# Patient Record
Sex: Male | Born: 1975 | Race: Black or African American | Hispanic: No | Marital: Single | Smoking: Never smoker
Health system: Southern US, Community
[De-identification: ages and names within clinical notes are randomized; demographics above are authoritative.]

## PROBLEM LIST (undated history)

## (undated) DIAGNOSIS — D649 Anemia, unspecified: Secondary | ICD-10-CM

## (undated) DIAGNOSIS — L0231 Cutaneous abscess of buttock: Secondary | ICD-10-CM

## (undated) DIAGNOSIS — B019 Varicella without complication: Secondary | ICD-10-CM

## (undated) DIAGNOSIS — L02419 Cutaneous abscess of limb, unspecified: Secondary | ICD-10-CM

## (undated) DIAGNOSIS — Z5189 Encounter for other specified aftercare: Secondary | ICD-10-CM

## (undated) DIAGNOSIS — L732 Hidradenitis suppurativa: Secondary | ICD-10-CM

## (undated) HISTORY — DX: Varicella without complication: B01.9

## (undated) HISTORY — PX: OTHER SURGICAL HISTORY: SHX169

## (undated) HISTORY — PX: ABCESS DRAINAGE: SHX399

## (undated) HISTORY — DX: Cutaneous abscess of buttock: L02.31

## (undated) HISTORY — DX: Cutaneous abscess of limb, unspecified: L02.419

---

## 2000-12-14 ENCOUNTER — Emergency Department (HOSPITAL_COMMUNITY): Admission: EM | Admit: 2000-12-14 | Discharge: 2000-12-14 | Payer: Self-pay | Admitting: *Deleted

## 2001-01-03 ENCOUNTER — Emergency Department (HOSPITAL_COMMUNITY): Admission: EM | Admit: 2001-01-03 | Discharge: 2001-01-03 | Payer: Self-pay | Admitting: *Deleted

## 2001-03-28 ENCOUNTER — Emergency Department (HOSPITAL_COMMUNITY): Admission: EM | Admit: 2001-03-28 | Discharge: 2001-03-29 | Payer: Self-pay | Admitting: Emergency Medicine

## 2001-03-29 ENCOUNTER — Emergency Department (HOSPITAL_COMMUNITY): Admission: EM | Admit: 2001-03-29 | Discharge: 2001-03-29 | Payer: Self-pay | Admitting: Emergency Medicine

## 2001-05-21 ENCOUNTER — Inpatient Hospital Stay (HOSPITAL_COMMUNITY): Admission: EM | Admit: 2001-05-21 | Discharge: 2001-05-27 | Payer: Self-pay

## 2001-06-01 ENCOUNTER — Inpatient Hospital Stay (HOSPITAL_COMMUNITY): Admission: RE | Admit: 2001-06-01 | Discharge: 2001-06-08 | Payer: Self-pay | Admitting: Surgery

## 2001-06-09 ENCOUNTER — Encounter (HOSPITAL_COMMUNITY): Admission: RE | Admit: 2001-06-09 | Discharge: 2001-07-14 | Payer: Self-pay | Admitting: Surgery

## 2010-07-02 ENCOUNTER — Inpatient Hospital Stay (HOSPITAL_COMMUNITY)
Admission: EM | Admit: 2010-07-02 | Discharge: 2010-07-11 | DRG: 579 | Disposition: A | Discharge status: Home Health | Payer: Self-pay | Attending: Internal Medicine | Admitting: Internal Medicine

## 2010-07-02 ENCOUNTER — Emergency Department (HOSPITAL_COMMUNITY): Payer: Self-pay

## 2010-07-02 DIAGNOSIS — D500 Iron deficiency anemia secondary to blood loss (chronic): Secondary | ICD-10-CM | POA: Diagnosis present

## 2010-07-02 DIAGNOSIS — L732 Hidradenitis suppurativa: Principal | ICD-10-CM | POA: Diagnosis present

## 2010-07-02 DIAGNOSIS — E43 Unspecified severe protein-calorie malnutrition: Secondary | ICD-10-CM | POA: Diagnosis present

## 2010-07-02 DIAGNOSIS — M8668 Other chronic osteomyelitis, other site: Secondary | ICD-10-CM | POA: Diagnosis present

## 2010-07-02 LAB — DIFFERENTIAL
Basophils Absolute: 0 10*3/uL (ref 0.0–0.1)
Eosinophils Absolute: 0.5 10*3/uL (ref 0.0–0.7)
Lymphocytes Relative: 16 % (ref 12–46)
Monocytes Relative: 6 % (ref 3–12)
Neutrophils Relative %: 73 % (ref 43–77)

## 2010-07-02 LAB — CBC
Hemoglobin: 4.9 g/dL — CL (ref 13.0–17.0)
Platelets: 460 10*3/uL — ABNORMAL HIGH (ref 150–400)
RBC: 3.15 MIL/uL — ABNORMAL LOW (ref 4.22–5.81)
WBC: 9.1 10*3/uL (ref 4.0–10.5)

## 2010-07-02 LAB — BASIC METABOLIC PANEL
CO2: 24 mEq/L (ref 19–32)
Calcium: 8 mg/dL — ABNORMAL LOW (ref 8.4–10.5)
Chloride: 101 mEq/L (ref 96–112)
Creatinine, Ser: 0.69 mg/dL (ref 0.4–1.5)
GFR calc Af Amer: >60 mL/min (ref >60)
Sodium: 132 mEq/L — ABNORMAL LOW (ref 135–145)

## 2010-07-02 LAB — LACTIC ACID, PLASMA: Lactic Acid, Venous: 1.2 mmol/L (ref 0.5–2.2)

## 2010-07-02 LAB — OCCULT BLOOD, POC DEVICE: Fecal Occult Bld: POSITIVE

## 2010-07-03 DIAGNOSIS — R195 Other fecal abnormalities: Secondary | ICD-10-CM

## 2010-07-03 DIAGNOSIS — D509 Iron deficiency anemia, unspecified: Secondary | ICD-10-CM

## 2010-07-03 LAB — FERRITIN: Ferritin: 15 ng/mL — ABNORMAL LOW (ref 22–322)

## 2010-07-03 LAB — CBC
HCT: 20.3 % — ABNORMAL LOW (ref 39.0–52.0)
Hemoglobin: 5.7 g/dL — CL (ref 13.0–17.0)
MCH: 17.5 pg — ABNORMAL LOW (ref 26.0–34.0)
MCHC: 28.1 g/dL — ABNORMAL LOW (ref 30.0–36.0)
MCV: 62.5 fL — ABNORMAL LOW (ref 78.0–100.0)
RDW: 22.3 % — ABNORMAL HIGH (ref 11.5–15.5)

## 2010-07-03 LAB — HAPTOGLOBIN: Haptoglobin: 250 mg/dL — ABNORMAL HIGH (ref 16–200)

## 2010-07-03 LAB — IRON AND TIBC: Iron: <10 ug/dL — ABNORMAL LOW (ref 42–135)

## 2010-07-03 LAB — LACTATE DEHYDROGENASE: LDH: 54 U/L — ABNORMAL LOW (ref 94–250)

## 2010-07-03 LAB — COMPREHENSIVE METABOLIC PANEL
Albumin: 1.9 g/dL — ABNORMAL LOW (ref 3.5–5.2)
Alkaline Phosphatase: 112 U/L (ref 39–117)
BUN: 6 mg/dL (ref 6–23)
CO2: 25 mEq/L (ref 19–32)
Chloride: 107 mEq/L (ref 96–112)
Creatinine, Ser: 0.73 mg/dL (ref 0.4–1.5)
GFR calc non Af Amer: >60 mL/min (ref >60)
Glucose, Bld: 91 mg/dL (ref 70–99)
Potassium: 3.9 mEq/L (ref 3.5–5.1)
Total Bilirubin: 0.7 mg/dL (ref 0.3–1.2)

## 2010-07-03 LAB — TECHNOLOGIST SMEAR REVIEW

## 2010-07-03 LAB — DIRECT ANTIGLOBULIN TEST (NOT AT ARMC): DAT, IgG: NEGATIVE

## 2010-07-03 LAB — FOLATE: Folate: 8.9 ng/mL

## 2010-07-03 LAB — PROTIME-INR: INR: 1.19 (ref 0.00–1.49)

## 2010-07-03 LAB — ANTIBODY SCREEN: Antibody Screen: NEGATIVE

## 2010-07-03 LAB — HEMOGLOBIN AND HEMATOCRIT, BLOOD
HCT: 22 % — ABNORMAL LOW (ref 39.0–52.0)
Hemoglobin: 6.2 g/dL — CL (ref 13.0–17.0)

## 2010-07-03 LAB — PREPARE RBC (CROSSMATCH)

## 2010-07-03 LAB — VITAMIN B12: Vitamin B-12: 414 pg/mL (ref 211–911)

## 2010-07-03 MED ORDER — IOHEXOL 300 MG/ML  SOLN
100.0000 mL | Freq: Once | INTRAMUSCULAR | Status: AC | PRN
  Administered 2010-07-03: 100 mL via INTRAVENOUS

## 2010-07-04 ENCOUNTER — Other Ambulatory Visit: Payer: Self-pay | Admitting: Gastroenterology

## 2010-07-04 DIAGNOSIS — M869 Osteomyelitis, unspecified: Secondary | ICD-10-CM

## 2010-07-04 DIAGNOSIS — D509 Iron deficiency anemia, unspecified: Secondary | ICD-10-CM

## 2010-07-04 DIAGNOSIS — L732 Hidradenitis suppurativa: Secondary | ICD-10-CM

## 2010-07-04 LAB — CBC
HCT: 20.7 % — ABNORMAL LOW (ref 39.0–52.0)
MCH: 18.6 pg — ABNORMAL LOW (ref 26.0–34.0)
MCHC: 29 g/dL — ABNORMAL LOW (ref 30.0–36.0)
RDW: 24.1 % — ABNORMAL HIGH (ref 11.5–15.5)

## 2010-07-04 LAB — PATHOLOGIST SMEAR REVIEW

## 2010-07-04 LAB — BASIC METABOLIC PANEL
BUN: 4 mg/dL — ABNORMAL LOW (ref 6–23)
Calcium: 7.8 mg/dL — ABNORMAL LOW (ref 8.4–10.5)
Chloride: 106 mEq/L (ref 96–112)
GFR calc Af Amer: >60 mL/min (ref >60)
GFR calc non Af Amer: >60 mL/min (ref >60)
Glucose, Bld: 74 mg/dL (ref 70–99)
Potassium: 3.7 mEq/L (ref 3.5–5.1)

## 2010-07-04 LAB — HEMOGLOBIN A1C
Hgb A1c MFr Bld: 5.4 % (ref <5.7)
Mean Plasma Glucose: 108 mg/dL (ref <117)

## 2010-07-05 ENCOUNTER — Inpatient Hospital Stay (HOSPITAL_COMMUNITY): Payer: Self-pay

## 2010-07-05 DIAGNOSIS — M869 Osteomyelitis, unspecified: Secondary | ICD-10-CM

## 2010-07-05 DIAGNOSIS — L732 Hidradenitis suppurativa: Secondary | ICD-10-CM

## 2010-07-05 LAB — HEPATIC FUNCTION PANEL
ALT: 8 U/L (ref 0–53)
AST: 13 U/L (ref 0–37)
Albumin: 1.9 g/dL — ABNORMAL LOW (ref 3.5–5.2)
Total Protein: 8.1 g/dL (ref 6.0–8.3)

## 2010-07-05 LAB — CBC
Platelets: 420 10*3/uL — ABNORMAL HIGH (ref 150–400)
RBC: 3.53 MIL/uL — ABNORMAL LOW (ref 4.22–5.81)
WBC: 9.3 10*3/uL (ref 4.0–10.5)

## 2010-07-06 LAB — CULTURE, ROUTINE-ABSCESS

## 2010-07-06 LAB — CROSSMATCH
ABO/RH(D): B POS
Unit division: 0
Unit division: 0

## 2010-07-06 LAB — CBC
HCT: 23.7 % — ABNORMAL LOW (ref 39.0–52.0)
Hemoglobin: 7 g/dL — ABNORMAL LOW (ref 13.0–17.0)
MCH: 19.9 pg — ABNORMAL LOW (ref 26.0–34.0)
MCHC: 29.5 g/dL — ABNORMAL LOW (ref 30.0–36.0)

## 2010-07-07 ENCOUNTER — Inpatient Hospital Stay (HOSPITAL_COMMUNITY): Payer: Self-pay

## 2010-07-07 DIAGNOSIS — K922 Gastrointestinal hemorrhage, unspecified: Secondary | ICD-10-CM

## 2010-07-07 LAB — CBC
HCT: 25.1 % — ABNORMAL LOW (ref 39.0–52.0)
MCV: 68.4 fL — ABNORMAL LOW (ref 78.0–100.0)
RDW: 27.4 % — ABNORMAL HIGH (ref 11.5–15.5)
WBC: 9.9 10*3/uL (ref 4.0–10.5)

## 2010-07-08 ENCOUNTER — Inpatient Hospital Stay (HOSPITAL_COMMUNITY): Payer: Self-pay

## 2010-07-08 LAB — PROTEIN ELECTROPH W RFLX QUANT IMMUNOGLOBULINS
Beta 2: 13.7 % — ABNORMAL HIGH (ref 3.2–6.5)
Beta Globulin: 7.2 % (ref 4.7–7.2)
Gamma Globulin: 36.1 % — ABNORMAL HIGH (ref 11.1–18.8)
M-Spike, %: NOT DETECTED g/dL
Total Protein ELP: 8.1 g/dL (ref 6.0–8.3)

## 2010-07-08 LAB — BASIC METABOLIC PANEL
BUN: 5 mg/dL — ABNORMAL LOW (ref 6–23)
CO2: 26 mEq/L (ref 19–32)
Calcium: 8.1 mg/dL — ABNORMAL LOW (ref 8.4–10.5)
Creatinine, Ser: 0.76 mg/dL (ref 0.4–1.5)
GFR calc Af Amer: >60 mL/min (ref >60)
Glucose, Bld: 76 mg/dL (ref 70–99)

## 2010-07-08 LAB — CBC
Hemoglobin: 7.7 g/dL — ABNORMAL LOW (ref 13.0–17.0)
MCH: 20.4 pg — ABNORMAL LOW (ref 26.0–34.0)
MCHC: 29.6 g/dL — ABNORMAL LOW (ref 30.0–36.0)

## 2010-07-08 LAB — IMMUNOFIXATION ADD-ON

## 2010-07-09 ENCOUNTER — Inpatient Hospital Stay (HOSPITAL_COMMUNITY): Payer: Self-pay

## 2010-07-09 ENCOUNTER — Other Ambulatory Visit: Payer: Self-pay | Admitting: Diagnostic Radiology

## 2010-07-09 DIAGNOSIS — M869 Osteomyelitis, unspecified: Secondary | ICD-10-CM

## 2010-07-09 DIAGNOSIS — L732 Hidradenitis suppurativa: Secondary | ICD-10-CM

## 2010-07-09 LAB — CULTURE, BLOOD (ROUTINE X 2): Culture: NO GROWTH

## 2010-07-09 LAB — CBC
MCH: 20.6 pg — ABNORMAL LOW (ref 26.0–34.0)
MCV: 70.6 fL — ABNORMAL LOW (ref 78.0–100.0)
Platelets: 351 10*3/uL (ref 150–400)
RBC: 3.88 MIL/uL — ABNORMAL LOW (ref 4.22–5.81)
RDW: 29.5 % — ABNORMAL HIGH (ref 11.5–15.5)
WBC: 10.4 10*3/uL (ref 4.0–10.5)

## 2010-07-09 LAB — IGG, IGA, IGM
IgA: 1350 mg/dL — ABNORMAL HIGH (ref 68–378)
IgG (Immunoglobin G), Serum: 2960 mg/dL — ABNORMAL HIGH (ref 694–1618)

## 2010-07-09 LAB — PREALBUMIN: Prealbumin: 7.5 mg/dL — ABNORMAL LOW (ref 17.0–34.0)

## 2010-07-10 NOTE — Consult Note (Signed)
NAME:  Martin [name], Martin [name]             ACCOUNT NO.:  [id number]  MEDICAL RECORD NO.:  [id number]           PATIENT TYPE:  I  LOCATION:  3740                         FACILITY:  [facility]  PHYSICIAN:  Gabrielle Dare. Janee Morn, M.D.DATE OF BIRTH:  29-Mar-1976  DATE OF CONSULTATION:  07/03/2010 DATE OF DISCHARGE:                                CONSULTATION   CONSULTATION TIME:  10:08 a.m.  REQUESTING PHYSICIAN:  Baltazar Najjar, MD  CONSULTING SURGEON:  Gabrielle Dare. Janee Morn, MD  GASTROENTEROLOGIST CONSULTING:  Rachael Fee, MD  REASON FOR CONSULTATION:  Back and buttock abscesses.  HISTORY OF PRESENT ILLNESS:  Martin [name] is a 35 year old black male with no significant past medical history except for prior incision and drainage of a right axillary abscess 5 years ago.  He had this abscess drained at [facility].  This was completed, then after this, he said he then developed these areas all along his buttock, along the posterior right thigh, as well as up into the sacral area.  They stopped draining at one point approximately 3 years ago, but has since then chronically draining ever.  The patient has never sought treatment for these until last night when he got tired, other than presented to the emergency department.  Upon arrival to the emergency department, the patient was also found to be anemic with a hemoglobin of 4.9.  The patient denies any melena or any difficulties with his bowels.  A CT scan of the pelvis as well as a left femur was obtained which revealed a complete destruction of the coccyx possibly secondary to osteomyelitis along with other findings.  Multiple draining wound and complex abscesses, but no definite drainable abscess.  The patient has been admitted and we have been asked to evaluate the patient for these draining wound.  REVIEW OF SYSTEMS:  Please see HPI.  Otherwise, all other systems have been reviewed and are negative except for a finding of weight  loss approximately 30-40 pounds.  FAMILY HISTORY:  Essentially negative.  PAST MEDICAL HISTORY:  Prior history of right axillary abscess and 5- year history of buttock, posterior left thigh, and sacral draining wound.  PAST SURGICAL HISTORY:  I and D of right axillary abscess.  SOCIAL HISTORY:  The patient is single with no children.  He does not work.  He denies any tobacco, but does admit to some alcohol use.  The way he makes it sound is that he was a much heavier drinker when he was younger, but has since began to drink less.  He has a history of illicit drug use, but is clean.  ALLERGIES:  NKDA.  MEDICATIONS AT HOME:  None.  PHYSICAL EXAMINATION:  GENERAL:  Martin [name] is a pleasant 36 year old black male who is currently lying in bed in no acute distress. VITAL SIGNS:  Temperature 97.6 pulse 79, respirations 18, blood pressure 96/59. HEENT:  Head is normocephalic, atraumatic.  Sclerae noninjected.  Pupils are equal, round, and reactive to light.  Ears, nose without any obvious masses or lesions.  No rhinorrhea.  Mouth is pink.  Throat shows no exudate. HEART:  Regular rate and rhythm.  Normal S1 and S2.  No murmurs, gallops, or rubs are noted.  He does have palpable carotid, radial, and pedal pulses bilaterally. LUNGS:  Clear to auscultation bilaterally with no wheezes, rhonchi, or rales noted.  Respiratory effort is nonlabored. ABDOMEN:  Soft and nontender, nondistended with active bowel sounds.  No organomegaly or masses are noted. RECTAL:  Deferred at this time. MUSCULOSKELETAL:  All four extremities are essentially symmetrical with no cyanosis, clubbing, or edema. SKIN:  The patient has extensive induration of the left posterior lateral thigh into his left buttock and into the gluteal cleft up towards his sacrum.  This appears to be like a long-chain of multiple chronic draining fistulous tract.  These tracts have a milky white purulent drainage that is somewhat  malodorous.  These are somewhat tender to palpation.  Otherwise, no definite fluctuant area is indicated, large abscess or erythema are noted.  Like this under the right axilla, the patient does have a scar noted from his prior I and D. This is also draining the same type of purulent fluid to see that is coming from the lower areas. Along the posterior lateral thigh, the patient does have several areas of crater-like wound scattered down to the leg.  These are pink in nature, however, they are very dry and do not appear to be fresh wound. NEUROLOGIC:  Cranial nerves II through XII appear to be grossly intact. Deep tendon reflex exam is deferred at this time. PSYCHIATRIC:  The patient is alert and oriented x3 with an appropriate affect.  LABORATORY DATA:  White blood cell count is 9300, hemoglobin is currently 5.7 up from 4.9, hematocrit is 20.3, platelet count is 344,000, INR is 1.19.  Sodium 139, potassium 3.9, glucose 91, BUN 6, creatinine 0.73.  LFTs were all normal.  Albumin is 1.9.  Fecal occult blood positive.  HIV is nonreactive.  Anemia panel reveals less than 10 of iron indicating iron deficiency anemia.  DIAGNOSTICS:  CT scan of the pelvis revealed a band-like subcutaneous opacities posterior to the pelvis, probably representing draining complex abscess.  These band-like opacities extend along the intragluteal fold as well.  There is destruction of the coccyx probably from osteomyelitis, less likely from the mass.  The CT scan of the femur reveals skin thickening extending along the left upper thigh medially and skin thickening and surface irregularity along the left posterior medial upper thigh probably related to infection and ulceration but no drainable abscess is seen and no osteomyelitis is seen.  IMPRESSION: 1. 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  Also under the     right axilla. 2. Iron deficiency anemia, severe. 3. 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  PLAN:  At this time, I will discuss this patient with Dr. Violeta Gelinas.  This is a very complex problem that a straightforward incision and drainage will not help.  If this truly is Hidradenitis suppurativa, the patient would likely need a plastic surgeon involved for more definitive care, then they require full excision and possible skin graft and skin flaps.  Currently that we agree with the addition of vancomycin and Zosyn.  We will obtain cultures.  He may need an Infectious Disease consultation regarding the treatment for the patient's osteomyelitis, however, the patient almost has complete destruction and I am unsure if the antibiotics are going  to make a significant differences for its further progression.  We agree with a GI workup given his severe profound anemia.  They will plan to do colonoscopy plus or minus an upper endoscopy tomorrow.  Further recommendations will be made per Dr. Janee Morn after his evaluation of the patient.     Letha Cape, PA   ______________________________ Gabrielle Dare Janee Morn, M.D.    KEO/MEDQ  D:  07/03/2010  T:  07/04/2010  Job:  [id number]  cc:   Rachael Fee, MD Baltazar Najjar, MD  Electronically Signed by Barnetta Chapel PA on 07/09/2010 01:57:22 PM Electronically Signed by Violeta Gelinas M.D. on 07/10/2010 01:44:30 PM

## 2010-07-10 NOTE — Consult Note (Signed)
NAME:  ARCHER, MOIST             ACCOUNT NO.:  [id number]  MEDICAL RECORD NO.:  [id number]           PATIENT TYPE:  I  LOCATION:  3740                         FACILITY:  [facility]  PHYSICIAN:  Rose Phi. Myna Hidalgo, M.D. DATE OF BIRTH:  10-07-1975  DATE OF CONSULTATION: DATE OF DISCHARGE:                                CONSULTATION   REFERRING PHYSICIAN:  Baltazar Najjar, MD  REASON FOR CONSULTATION: 1. Severe microcytic anemia. 2. 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 3. Osteomyelitis.  HISTORY OF PRESENT ILLNESS:  Mr. Muzquiz is a really nice 35 year old African American gentleman.  He has no obvious past medical history.  He has had a history of a chronic draining of scant abscess along the left buttock and left posterior thigh.  He has had these for about 5 years. He says he has had surgery in the past for this.  He denies any obvious bleeding.  He has had melena, bright red blood per rectum.  He has no hemoptysis or hematemesis.  There has been no hematuria.  He said he has lost about 40 pounds over the past 5 years. This has been a gradual weight loss.  He has been having more in the way of pain.  He has not seen a doctor for several years.  He subsequently came to the emergency room on the 21st.  His lab work when he showed up showed a white cell count of 9.1, hemoglobin 4.9, hematocrit 18.3, platelet count is 460.  MCV is 58.  He did have blood in stool.  His anemia panel showed a ferritin level of 115.  Total iron was less than 10.  His B12 and folate were okay.  His HIV was negative.  His metabolic panel should be a creatinine of 6 and 0.73.  Calcium was 7.9 with an albumin of 1.9.  Total protein was 8.7.  Reticulocyte count was nonexistent.  His LDH was actually low at 54.  He did have a Coombs test which was negative.  Hemoglobin was 250.  He was transfused with, I think, 2 units of blood.  GI had seen him.  They will scope him for an upper and lower endoscopy  on the 23rd.  He  did have a CT of the pelvis and femur.  CT of the pelvis showed drain complex abscesses along the left posterior lateral buttock region.  These have extended along with intragluteal fold.  There is an expansion of sciatic nerve likely due to inflammation.  There is reactive lymph nodes.  There is no obvious malignancy noted.  There is destruction of the coccyx, likely secondary to osteomyelitis.  He had skin thickening along the left upper medial thigh.  No obvious osteomyelitis along the femur.  He has been seen by surgery.  There is some consideration for surgical intervention.  He has gotten IV iron with INFeD.  I think he has ordered 2000 mg total.  We  were asked to see him and try to help with any other issues with anemia.  Again, he has had some night sweats.  He says these only happens maybe one or two  days out of the week.  He has had a negative PPD in the past.  His past medical history is unremarkable aside of the abscesses.  Allergies are none.  MEDICATIONS: 1. Protonix 40 mg p.o. daily. 2. Zosyn 0.375 g IV q.8 h.3. Vancomycin 1.25 mg q.12 h. 4. Zofran as indicated.  SOCIAL HISTORY:  He does not smoke.  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  He says he is cutting back on this. There is no history of social or recreational drug use.  FAMILY HISTORY:  Unremarkable.  He says that his sister apparently is starting to have some abscesses.  REVIEW OF SYSTEMS:  As in the history present illness.  PHYSICAL EXAMINATION:  GENERAL:  This is a well-developed, well- nourished African American gentleman in no obvious distress. VITAL SIGNS:  Temperature 97.8, pulse 71, respiratory rate 16, blood pressure 97/58. HEENT:  Normocephalic, atraumatic skull.  There are no ocular or oral lesions.  He has no palpable cervical or supraclavicular lymph nodes. There is no scleral icterus.  He has no enlarged tonsils. LUNGS:  Clear  bilaterally. CARDIAC:  Regular rate and rhythm with normal S1 and S2.  There are no murmurs, rubs, or bruits. ABDOMEN:  Soft, good bowel sounds.  There is no fluid wave.  There is no palpable abdominal mass.  No palpable hepatosplenomegaly. EXTREMITIES:  No clubbing, cyanosis, or edema. NEUROLOGIC:  No obvious neurological deficits.  Blood smear needs to be reviewed.  IMPRESSION:  Mr. Melman is a 35 year old gentleman with marked microcytic anemia.  One has to suspect this is all iron deficiency.  One has to suspect that he likely has some degree of anemia of chronic disease from these multiple abscesses.  I totally agree with the endoscopy recommendation.  Again, one has to suspect whether there is some kind of blood loss.  I do not suspect any obvious hematologic issue with his marrow.  I suppose given that he is African American, there may be hemoglobinopathy which could be contributing to the low MCV.  He says there is no obvious sickle cell in the family.  However, we will go ahead and check for hemoglobinopathy.  He is not hemolyzing.  He has no retic count.  His LDH is normal. Haptoglobin is normal.  As such, there is no evidence of any hemolyticissue.  I think it is interesting as to why he has these abscesses, we will check his immunoglobulins.  It is also interesting that his protein is high and albumin is low.  He will be awfully young to have a plasma cell disorder, but yet we will check this out.  The iron by itself, should be able to get his blood count better. However, this may take several weeks before we start to see a rise in his blood count.  We will certainly follow along and try to help out in any way that we can.  Thanks so much for allowing [place] to see Mr. Belloso again.  He is a real nice guy.     Rose Phi. Myna Hidalgo, M.D.     PRE/MEDQ  D:  07/04/2010  T:  07/05/2010  Job:  [id number]  cc:   Gabrielle Dare. Janee Morn, M.D. Dr. Arthor Captain Dr.  Maralyn Sago  Electronically Signed by Arlan Organ  on 07/10/2010 07:48:45 AM

## 2010-07-10 NOTE — Procedures (Signed)
Summary: Colonoscopy  Patient: [name] Note: All result statuses are Final unless otherwise noted.  Tests: (1) Colonoscopy (COL)   COL Colonoscopy           DONE     [facility]     [address]     [place], [place]  [address]          COLONOSCOPY PROCEDURE REPORT          PATIENT:  Martin [name], Martin [name]  MR#:  [id number]     BIRTHDATE:  1975/09/02, 34 yrs. old  GENDER:  male     ENDOSCOPIST:  Rachael Fee, MD     PROCEDURE DATE:  07/04/2010     PROCEDURE:  Diagnostic Colonoscopy     ASA CLASS:  Class II     INDICATIONS:  severe IDA, fobt +     MEDICATIONS:   Fentanyl 50 mcg IV, Versed 5 mg IV, There was     residual sedation effect present from prior procedure.          DESCRIPTION OF PROCEDURE:   After the risks benefits and     alternatives of the procedure were thoroughly explained, informed     consent was obtained.  Digital rectal exam was performed and     revealed no rectal masses.   The Pentax Colonoscope [id number]     endoscope was introduced through the anus and advanced to the     cecum, which was identified by both the appendix and ileocecal     valve, without limitations.  The quality of the prep was good,     using MoviPrep.  The instrument was then slowly withdrawn as the     colon was fully examined.     <<PROCEDUREIMAGES>>     FINDINGS:  A normal appearing cecum, ileocecal valve, and     appendiceal orifice were identified. The ascending, hepatic     flexure, transverse, splenic flexure, descending, sigmoid colon,     and rectum appeared unremarkable (see image001, image002, and     image004).   Retroflexed views in the rectum revealed no     abnormalities.    The scope was then withdrawn from the patient     and the procedure completed.     COMPLICATIONS:  None          ENDOSCOPIC IMPRESSION:     1) Normal colon; no polyps or cancers          RECOMMENDATIONS:     SBFT to check for tumors/lesions in SB that could account  for     IDA, heme +.  I suspect, however, that his anemia is mainly from     severe chronic disease and perhaps poor iron intake.          ______________________________     Rachael Fee, MD          n.     eSIGNED:   Rachael Fee at 07/04/2010 01:32 PM          [place], [id number]  Note: An exclamation mark (!) indicates a result that was not dispersed into the flowsheet. Document Creation Date: 07/04/2010 2:01 PM _______________________________________________________________________  (1) Order result status: Final Collection or observation date-time: 07/04/2010 13:26 Requested date-time:  Receipt date-time:  Reported date-time:  Referring Physician:   Ordering Physician: Rob Bunting [phone]) Specimen Source:  Source: [place] [place] Order Number: [id number] Lab site:

## 2010-07-10 NOTE — Procedures (Signed)
Summary: Upper Endoscopy  Patient: Martin [name] Note: All result statuses are Final unless otherwise noted.  Tests: (1) Upper Endoscopy (EGD)   EGD Upper Endoscopy       DONE     [facility]     [address]     [place], [place]  [address]          ENDOSCOPY PROCEDURE REPORT          PATIENT:  [name], [name]  MR#:  [id number]     BIRTHDATE:  04/19/1975, 34 yrs. old  GENDER:  male     ENDOSCOPIST:  Rachael Fee, MD     PROCEDURE DATE:  07/04/2010     PROCEDURE:  EGD with biopsy, [address]     ASA CLASS:  Class II     INDICATIONS:  severe IDA, heme +     MEDICATIONS:   Fentanyl 50 mcg IV, Versed 5 mg IV     TOPICAL ANESTHETIC:  Cetacaine Spray          DESCRIPTION OF PROCEDURE:   After the risks benefits and     alternatives of the procedure were thoroughly explained, informed     consent was obtained.  The Pentax Gastroscope [id number] endoscope     was introduced through the mouth and advanced to the second     portion of the duodenum, without limitations.  The instrument was     slowly withdrawn as the mucosa was fully examined.     <<PROCEDUREIMAGES>>     The upper, middle, and distal third of the esophagus were     carefully inspected and no abnormalities were noted. The z-line     was well seen at the GEJ. The endoscope was pushed into the fundus     which was normal including a retroflexed view. The antrum,gastric     body, first and second part of the duodenum were unremarkable.     Biopsies taken from duodenum to check for Celiac Sprue (see     image001, image002, image003, image004, and image005).     Retroflexed views revealed no abnormalities.    The scope was then     withdrawn from the patient and the procedure completed.     COMPLICATIONS:  None          ENDOSCOPIC IMPRESSION:     1) Normal EGD, biopsies taken from duodenum to check for Celiac     Sprue          RECOMMENDATIONS:     Will proceed with colonoscopy now.       ______________________________     Rachael Fee, MD          n.     eSIGNED:   Rachael Fee at 07/04/2010 01:11 PM          [place], [id number]  Note: An exclamation mark (!) indicates a result that was not dispersed into the flowsheet. Document Creation Date: 07/04/2010 2:01 PM _______________________________________________________________________  (1) Order result status: Final Collection or observation date-time: 07/04/2010 13:08 Requested date-time:  Receipt date-time:  Reported date-time:  Referring Physician:   Ordering Physician: Rob Bunting [id number]) Specimen Source:  Source: Launa [employer] Order Number: [id number] Lab site:

## 2010-07-12 LAB — CULTURE, ROUTINE-ABSCESS

## 2010-07-13 NOTE — H&P (Signed)
NAME:  Martin [name], Martin [name] NO.:  [id number]  MEDICAL RECORD NO.:  [id number]           PATIENT TYPE:  E  LOCATION:  [facility]                         FACILITY:  [facility]  PHYSICIAN:  Baltazar Najjar, MD     DATE OF BIRTH:  [birth year]  DATE OF ADMISSION:  07/02/2010 DATE OF DISCHARGE:                             HISTORY & PHYSICAL   PRIMARY CARE PHYSICIAN:  Unassigned.  CODE STATUS:  The patient is a full code.  CHIEF COMPLAINT:  Multiple left buttock and left thigh abscesses.  HISTORY OF PRESENT ILLNESS:  Martin [name] is a 35 year old African American man presented to the hospital today with a chief complaint of multiple skin abscesses along his left buttock and posterior aspect of his left thigh.  As per him, he have those lesions for several years more than 5 years.  He was last seen at [facility] above 5 years ago.  However, never saw a doctor afterwards and as per his description, there was excessive drain on and off, described that the drainage as purulent drainage and since the drainage has been getting worse, the patient decided to come to the ER.  He denies any fever or chills.  Denies any nausea, vomiting or change in his bowel habits.  He stated that he lost more than 40 pounds of weight over the last several years because he was feeling down and not eating well.  In the ED, the patient was found to be profoundly anemic with a hemoglobin of 4.  His stool for occult blood was positive and we were asked to see him for admission.  PAST MEDICAL HISTORY:  Recurrent skin abscesses as above.  ALLERGIES:  No known drug allergies.  MEDICATIONS:  None.  FAMILY HISTORY:  He had a sister with similar skin lesions.  SOCIAL HISTORY:  Lives with his mother.  Denies smoking or illicit drug use.  He drinks alcohol occasionally.  REVIEW OF SYSTEMS:  As above in HPI.  CHEST:  Denies any cough or shortness of breath.  CARDIOVASCULAR:  Denies any chest pain, palpitation.   ABDOMEN:  Denies any nausea, vomiting or change in his bowel habits.  Denies any hematochezia or hemoptysis.  GU:  Denies any dysuria or urinary frequency.  CONSTITUTIONAL:  Denies any fever or chills.  PHYSICAL EXAMINATION:  VITAL SIGNS:  Blood pressure 106/70, pulse 80, temperature 98.2, O2 sat 100% on room air. GENERAL:  He is alert, not in acute distress. NECK:  Supple.  No JVD. LUNGS:  Clear to auscultation bilaterally. CARDIOVASCULAR:  S1-S2, regular rhythm and rate. ABDOMEN:  Soft, nontender. EXTREMITIES:  Showed no pedal edema. SKIN:  Showed multiple skin ulcers/abscesses extending from his left gluteal region down his left buttock and left posterior thigh.  The largest lesion is about 5 x 3 cm.  There is some positive drainage from some of those lesions noted. NEUROLOGIC:  The patient is alert and oriented x3.  No focal deficit appreciated.  LABORATORY STUDIES:  Stool for occult blood positive.  Hemoglobin 4.9, hematocrit 18.3, WBCs 9.1 and platelet count 460.  Lactic acid 1.2. Sodium 132, potassium 3.6, BUN 9,  creatinine 0.69, calcium of 8.  RADIOLOGY/IMAGING:  CT of the left femur with contrast and CT of pelvis with contrast showed band-like subcutaneous opacities posterior to the pelvis probably represents draining complex of abscesses, draining along the left posterolateral buttock region, this band-like opacity is extend along the intragluteal fold.  There is an extension of the left sciatic nerve which may be due to inflammation.  Reactive lower basic lymph nodes present.  No draining abscess identified and component of the band- like opacities may represent phlegmon.  Destruction of the coccyx probably from osteomyelitis and this likely from a mass.  There is also possibility of early Fournier gangrene.  CT of femur showed skin thickening extend along the left upper thigh medially and skin thickening and surface irregularity on the left posteromedial upper thigh  probably related to infection and ulceration.  No drainable abscess seen.  No osteomyelitis noted.  ASSESSMENT AND PLAN: 1. Multiple gluteal/buttock/left thigh abscesses. 2. 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 3. Profound anemia with positive fecal occult blood.  PLAN:  The patient will be admitted to the medical floor. We will send blood cultures x2 and start the patient on vancomycin and Zosyn. Surgical Service was already contacted.  Dr. Lindie Spruce  from General Surgery will see him in the a.m. to assess him for the need for surgical debridement. Wound care consult. For anemia, the patient is asymptomatic and it seems to be chronic anemia.  The patient is typed and crossed and he will receive 2 units of blood transfusion.  We will repeat his H and H q.8 h., transfuse as needed.  I will also consult GI for further evaluation of his anemia and further need for scoping.  We will place him on clear liquid diet and IV Protonix. I will check his HIV status. The patient is full code.          ______________________________ Baltazar Najjar, MD     SA/MEDQ  D:  07/02/2010  T:  07/02/2010  Job:  [id number]  Electronically Signed by Hannah Beat MD on 07/13/2010 08:55:54 PM

## 2010-07-14 LAB — CULTURE, ROUTINE-ABSCESS
Culture: NO GROWTH
Gram Stain: NONE SEEN

## 2010-07-21 NOTE — Discharge Summary (Signed)
NAME:  Martin [name], Martin [name] NO.:  [id number]  MEDICAL RECORD NO.:  [id number]           PATIENT TYPE:  I  LOCATION:  3038                         FACILITY:  [facility]  PHYSICIAN:  Jeoffrey Massed, MD    DATE OF BIRTH:  [birth year]  DATE OF ADMISSION:  07/02/2010 DATE OF DISCHARGE:  07/11/2010                        DISCHARGE SUMMARY - REFERRING   PRIMARY DISCHARGE DIAGNOSES: 1. Extensive hidradenitis suppurativa involving his left gluteal     region, left posterior thigh mostly and his lower back as well.     Also involving his left axillary region. 2. Microcytic iron-deficiency anemia with EGD and colonoscopy     negative. 3. Malnutrition. 4. Coccygeal osteomyelitis, likely chronic.  DISCHARGE MEDICATIONS:  Include the following, 1. Doxycycline 100 mg p.o. b.i.d. for 30 days. 2. Ensure 237 mL p.o. twice daily. 3. Ferrous sulfate 325 mg 1 tablet p.o. twice daily. 4. Multivitamins 1 tablet p.o. daily. 5. Protein supplement liquid 30 mL p.o. twice daily.  CONSULTATIONS: 1. Dr. Orvan Falconer from Infectious Disease. 2. [facility]. 3. Hematology, Dr. Arlan Organ. 4. Dr. Noelle Penner from Plastic Surgery.  BRIEF HISTORY OF PRESENT ILLNESS:  The patient is a very pleasant 35- year-old unfortunate African-American male with a history of hidradenitis suppurativa, which has been going on for years.  Came into the ED for multiple draining ulcers on his left buttock and left thigh region.  He was then admitted to the hospitalist service for further evaluation and treatment.  For further details, please see the history and physical that was dictated by Dr. Baltazar Najjar.  PROCEDURES PERFORMED: 1. The patient underwent EGD on July 04, 2010, which was essentially     normal. 2. The patient also underwent a colonoscopy on July 04, 2010, which     was essentially normal. 3. The patient underwent a CT-guided biopsy of his coccygeal bone,     which showed inflammatory  cells, predominantly neutrophils.  RADIOLOGICAL STUDIES: 1. CT of the pelvis with contrast showed band-like subcutaneous     opacities posterior to the pelvis, probably representing a draining     complex of abscesses.  These band-like opacities extend along into     the intragluteal fold.  There is expansion of the left sciatic     nerve, which may be due to inflammation.  Reactive lower pelvic     lymph nodes are present.  No definite fluid-filled nondraining     abscesses is identified, and components of band-like opacities may     represent a phlegmon.  Destruction of the coccyx, probably from     osteomyelitis and less likely from a mass. 2. CT of the upper leg with contrast showed skin thickening extends     along this left upper thigh medially and skin thickening and     surface irregularity along the left posterior medial upper thigh,     probably related to infection and ulceration. 3. Small bowel series showed normal appearing small bowel with a large     stool burden.  PERTINENT LABORATORY STUDIES: 1. Culture of the coccygeal biopsy is negative so far. 2. Blood cultures done on July 03, 2010, were negative. 3. IgA in the serum was 1350, IgG was 2960, and IgM was 153. 4. Immunofixation showed area of slightly restricted mobility in the     IgG and  kappa links.  Suggested to repeat in 6 to 8 months if     clinically indicated. 5. Serum protein electrophoresis did not detect an M-spike. 6. Culture of the abscess was done on July 03, 2010, was negative. 7. HbA1c is 5.4. 8. TSH is 2.010, LDH was 54. 9. Direct antiglobulin test was negative. 10.Fecal occult blood was positive.  BRIEF HOSPITAL COURSE: 1. Hidradenitis suppurativa.  The patient was admitted to the     hospital, given empiric vancomycin and Zosyn.  Given a CT scan     results as noted above, General Surgery was consulted and they did     recommend that there was no abscesses to drain.  Subsequently,      Plastic Surgery was consulted to evaluate for debridement and flap.     Plastic Surgery did evaluate this patient and for his sacrum,     scrotum, and perineum wound, they thought that these wounds were     too large to be reconstructed.  However, they thought thatthe axillary wounds     could be possibly excised with wide local excision and     reconstruction.  However, upon further evaluation, they did     recommend a Dermatology evaluation and subsequently that was     provided by [facility].  They thought that this patient     should be transferred to academic center like [facility].  Dr. Noelle Penner did discuss     this case with the Dermatology clinic at the [facility] and subsequently     has instructed the patient to call the provided number to make an     appointment.  The patient has called this number and has made an     appointment with Dr. Caryn Section for August 06, 2010, at 2 p.m. at [facility].  In     terms of antibiotics, the CAT scan did show that he had some     coccygeal destruction.  It was felt that before we commit him to     long-term IV antibiotics to do a biopsy to see if this inflammation     was active.  The biopsy was done.  The cultures are so far negative     and the pathology does show that he has inflammatory cell mostly     neutrophils.  Dr. Orvan Falconer did follow this patient today and he has     suggested that this patient be switched to doxycycline for 30 days     as noted above.  The patient will follow up with [facility] on August 06, 2010, for possible immunotherapy.  His PICC     line will be now discontinued.  [facility]     [facility]ound care in the interim. 2. Iron deficiency anemia.  The patient had an extensive workup     including EGD and colonoscopy, both of which were normal.  He also     had a small-bowel follow-through that did not demonstrate a mass.     The patient was seen by  Hematology and Gastroenterology.  He was     given IV iron and then eventually switched over to oral iron, which  he will continue. 3. Malnutrition.  The patient's malnutrition is probably due to his     chronic inflammatory state and poor oral intake.  He has been     provided the above-noted supplements. 4. Coccygeal destruction.  This is probably secondary to his chronic     inflammatory state, perhaps chronic     osteomyelitis.  His biopsy results are noted as above.  The patient     will be taking doxycycline 100 mg p.o. b.i.d. for 30 days.  Total time spent for coordinating discharge is 45 minutes.     Jeoffrey Massed, MD     SG/MEDQ  D:  07/11/2010  T:  07/11/2010  Job:  [id number]  cc:   Loreta Ave, MD Dr. Caryn Section at [facility]  Electronically Signed by Jeoffrey Massed  on 07/21/2010 08:12:41 PM

## 2010-07-31 NOTE — Group Therapy Note (Signed)
NAME:  Martin [name], Martin [name] NO.:  [id number]  MEDICAL RECORD NO.:  [id number]           PATIENT TYPE:  I  LOCATION:  3038                         FACILITY:  [facility]  PHYSICIAN:  Clydia Llano, MD       DATE OF BIRTH:  Nov 02, 1975                                PROGRESS NOTE   PRIMARY CARE PHYSICIAN: [facility].  REASON FOR ADMISSION: Multiple skin lesions and severe anemia.  CURRENT DIAGNOSES: 1. Hidradenitis suppurativa 2. Microcytic iron-deficiency anemia. 3. Malnutrition 4. Coccygeal osteomyelitis.  Discharge medications to be dictated at the time of actual discharge.  RADIOLOGY: 1. Chest x-ray, March 27, showed right PICC line with tip within the     cavoatrial junction. 2. Small-bowel followthrough.  Normal-appearing small bowel. 3. CT scan of pelvis showed band-like subcutaneous opacity posterior     to the pelvis, probably representing a draining complex of     abscesses.  There is expansion of the left sciatic nerve which     might be due to inflammation.  There is reactive lower pelvic lymph     nodes.  There are no definite fluid-filled nondraining abscesses     identified. 4. CT scan of the femur showed skin thickening extending along the     left upper thigh medially and skin thickening and surface     irregularity along the left posteromedial upper thigh probably     related to infection and ulceration.  There is also destruction of     the coccyx probably from osteomyelitis and is less likely from a     mass.  PROCEDURES: 1. EGD, March 23, showed normal EGD. 2. Colonoscopy showed normal colon on March 23. 3. Blood transfusion of 40 units.  BRIEF HISTORY AND EXAMINATION: Martin [name] is a 35 year old African American male with history of hidradenitis suppurativa, came into the hospital complaining about worsening of his lesions.  The patient has multiple skin lesions along his left gluteal region and the posterior upper aspect of his  left thigh.  As per him, he had this lesion for several years.  He had surgery in 2003 for it.  But recently he noticed there is more drainage and felt very weak lately.  Upon initial evaluation in the emergency department, the patient told him that he lost about 40 pounds over the past several years.  He is found to be profoundly anemic with hemoglobin of 4.9.  He has positive stool occult blood and he has very extensive lesions in the gluteal and at the right axillary regions.  BRIEF HOSPITAL STAY: 1. Hidradenitis suppurativa.  The patient admitted to the hospital,     started on IV antibiotics vancomycin and Zosyn for possible     infection.  General Surgery was consulted to evaluate the patient.     They did recommend to continue antibiotics and there were no     abscesses to drain.  General Surgery recommended to call Plastic     Surgery to evaluate for debridement/skin flaps.  I called Dr.     Noelle Penner.  Dr. Noelle Penner and his nurse Amy is going to come and evaluate  the patient in the morning.  Meanwhile continue the antibiotics. 2. Coccygeal osteomyelitis.  The patient on vancomycin and Zosyn since     admission.  The osteomyelitis diagnosis is based on the destruction     of the coccyx which might be secondary to something else, but it is     likely secondary to osteomyelitis.  I spoke with Dr. Orvan Falconer from     Infectious Diseases and he recommended biopsy to be done to rule     out other causes of destruction of the coccyx as well as to see of     the infection is active or not and hopefully if we can get     microbiologic diagnosis of the osteomyelitis.  CT scan-guided     biopsy is ordered for Interventional Radiology to check on him. 3. Profound anemia.  The patient has microcytic deficiency anemia.     The patient has positive fecal occult blood when he came in.     [facility] was consulted.  EGD and colonoscopy were done which were     normal.  Small-bowel followthrough  also were normal.  The profound     iron-deficiency anemia might be from his nutritional status plus     chronic inflammatory status from his hidradenitis suppurativa and     osteomyelitis. 4. Malnutrition.  This patient's malnutrition is probably secondary to     chronic inflammation and poor oral intake.  His albumin is 1.9 and     prealbumin is 3.9.  I will repeat these tests in the morning.  As     the patient was started on multivitamin and seen by the dietitian     in the hospital hoping it will improve his nutritional status. 5. Iron deficiency.  It is hard to say it is secondary to chronic     blood loss or poor oral intake.  The patient got intravenous iron     with a total of 2000 elemental mg of iron.  The patient also put on     iron supplements for now.     Clydia Llano, MD     ME/MEDQ  D:  07/08/2010  T:  07/08/2010  Job:  [id number]  Electronically Signed by Clydia Llano  on 07/31/2010 05:06:08 PM

## 2010-08-07 LAB — FUNGUS CULTURE W SMEAR

## 2010-12-18 ENCOUNTER — Emergency Department (HOSPITAL_COMMUNITY)
Admission: EM | Admit: 2010-12-18 | Discharge: 2010-12-18 | Disposition: A | Discharge status: Home / Self Care | Payer: Self-pay | Attending: Emergency Medicine | Admitting: Emergency Medicine

## 2010-12-18 DIAGNOSIS — L299 Pruritus, unspecified: Secondary | ICD-10-CM | POA: Insufficient documentation

## 2010-12-18 DIAGNOSIS — H9209 Otalgia, unspecified ear: Secondary | ICD-10-CM | POA: Insufficient documentation

## 2010-12-18 DIAGNOSIS — R21 Rash and other nonspecific skin eruption: Secondary | ICD-10-CM | POA: Insufficient documentation

## 2010-12-18 DIAGNOSIS — R609 Edema, unspecified: Secondary | ICD-10-CM | POA: Insufficient documentation

## 2010-12-18 DIAGNOSIS — H7290 Unspecified perforation of tympanic membrane, unspecified ear: Secondary | ICD-10-CM | POA: Insufficient documentation

## 2010-12-18 DIAGNOSIS — L02419 Cutaneous abscess of limb, unspecified: Secondary | ICD-10-CM | POA: Insufficient documentation

## 2010-12-18 DIAGNOSIS — H6690 Otitis media, unspecified, unspecified ear: Secondary | ICD-10-CM | POA: Insufficient documentation

## 2012-05-30 IMAGING — CT CT PELVIS W/ CM
2 of 7 series · 15 of 46 positions shown, 17 images · IV contrast (APPLIED)
Comparison: None.
COMPARISON: None.

CT THE PELVIS WITH CONTRAST; CT OF THE LEFT FEMUR WITH CONTRAST
CLINICAL DATA: Infection. Boil in the left posterior buttock
region extending down the femur.

CT PELVIS WITH CONTRAST
TECHNIQUE: Multidetector CT imaging of the pelvis was performed
using the standard protocol following the bolus administration of
intravenous contrast.
Contrast:  100 ml Smnipaque-5RR
TECHNIQUE: Multidetector CT imaging of the left upper leg was
performed according to the standard protocol following intravenous
contrast administration. Multiplanar CT image reconstructions were
also generated.
Contrast: 100 ml Smnipaque-5RR

[Series 6: pelvis 2.0 b31f · axial · 0.40mm/px · z∈[+545,+1084]mm · 12 of 589 slices shown, 14 images]
[im 25/589  soft-tissue]
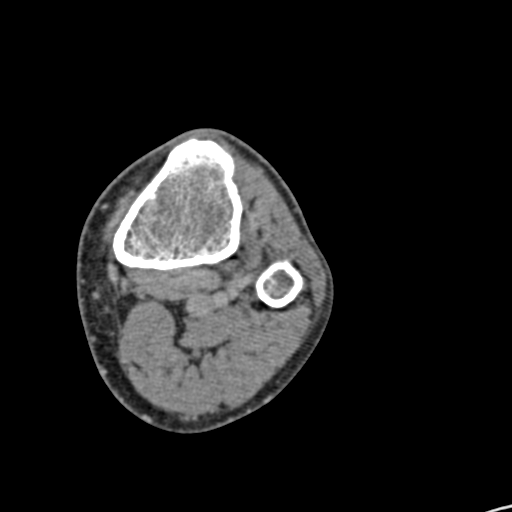
[im 25/589  bone]
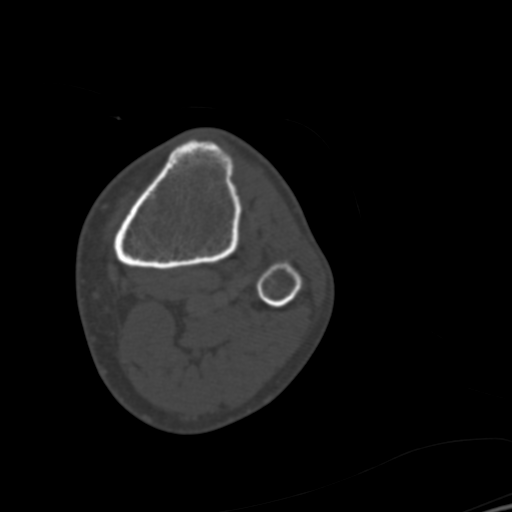
[im 74/589  soft-tissue]
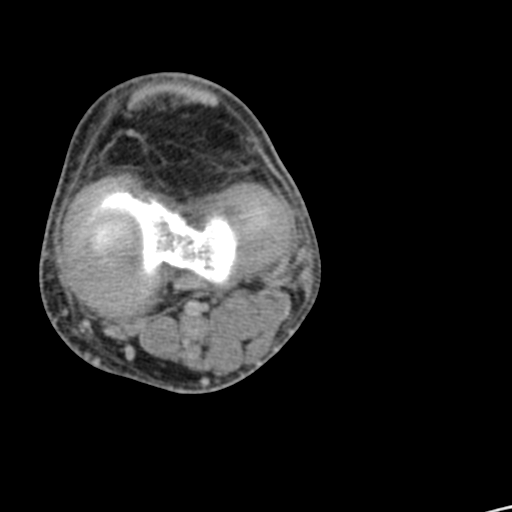
[im 123/589  soft-tissue]
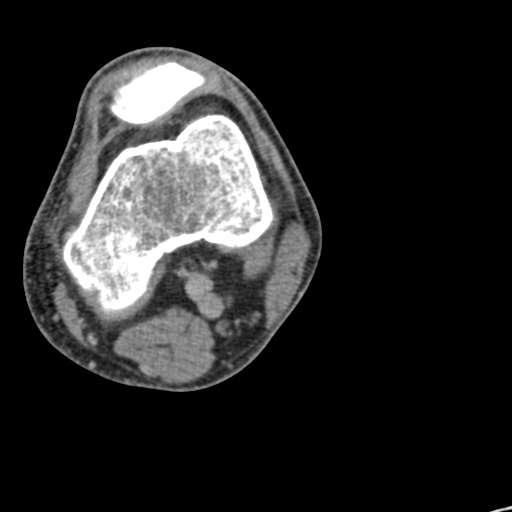
[im 172/589  soft-tissue]
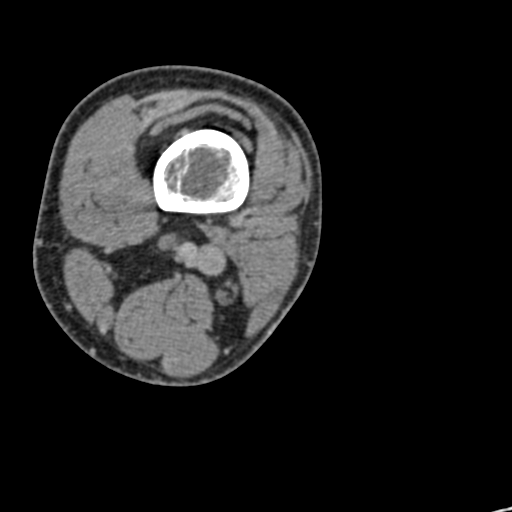
[im 221/589  soft-tissue]
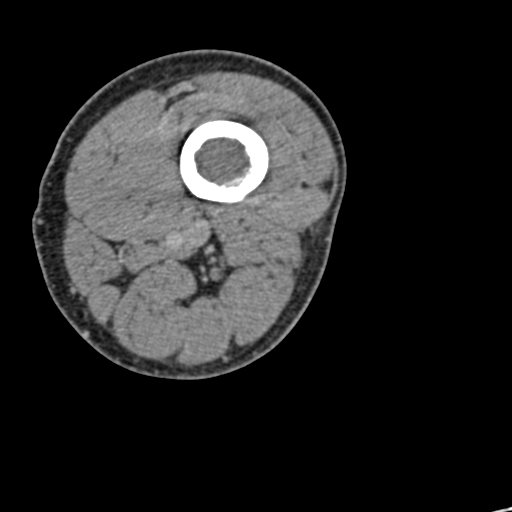
[im 270/589  soft-tissue]
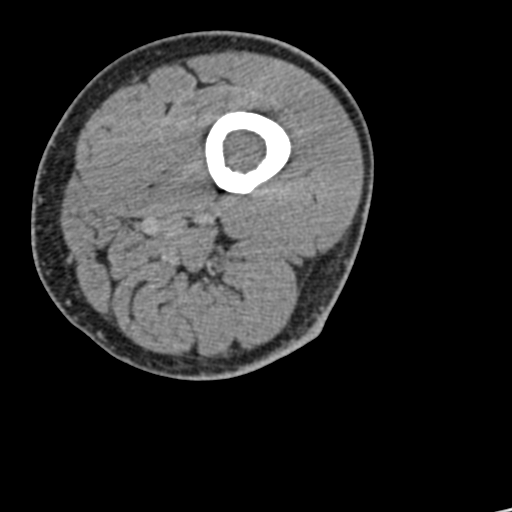
[im 319/589  soft-tissue]
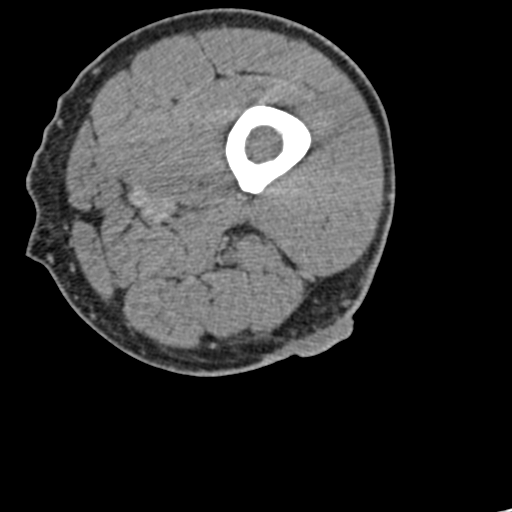
[im 368/589  soft-tissue]
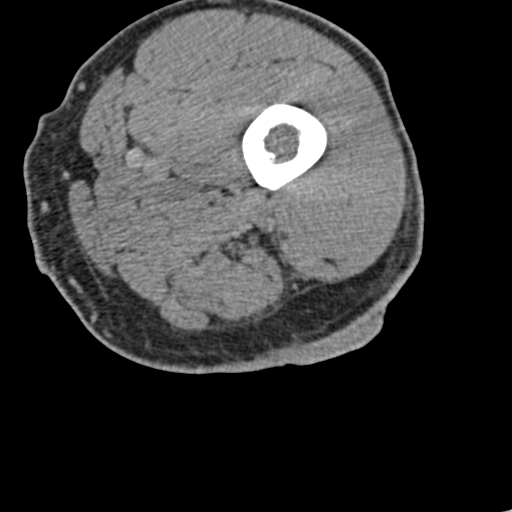
[im 417/589  soft-tissue]
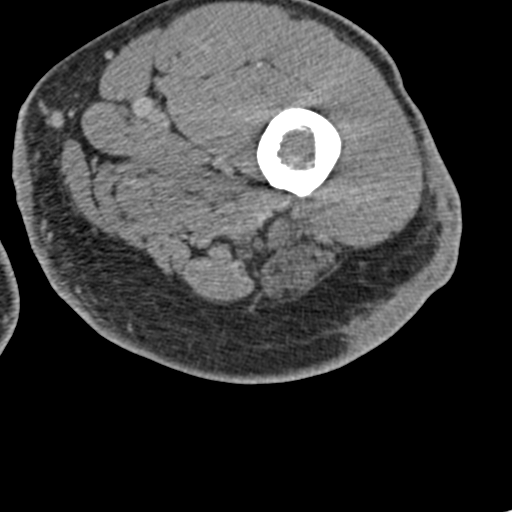
[im 417/589  bone]
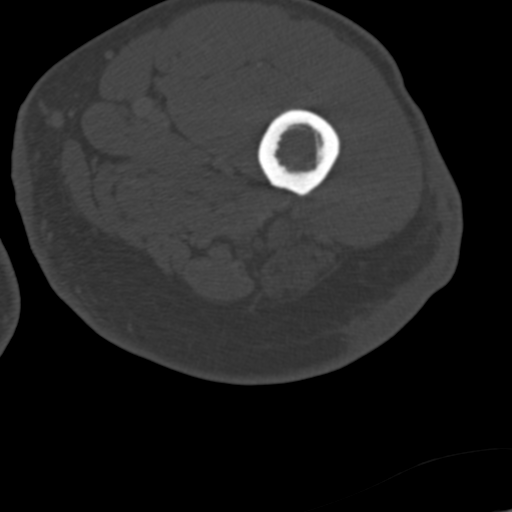
[im 466/589  soft-tissue]
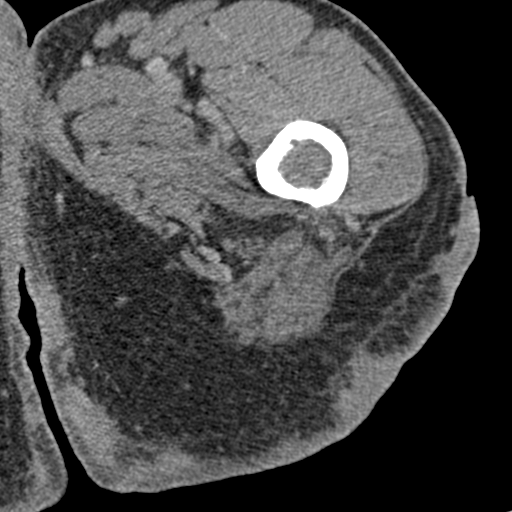
[im 515/589  soft-tissue]
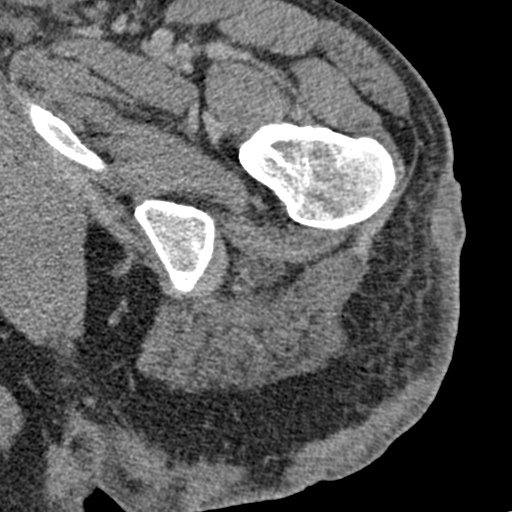
[im 564/589  soft-tissue]
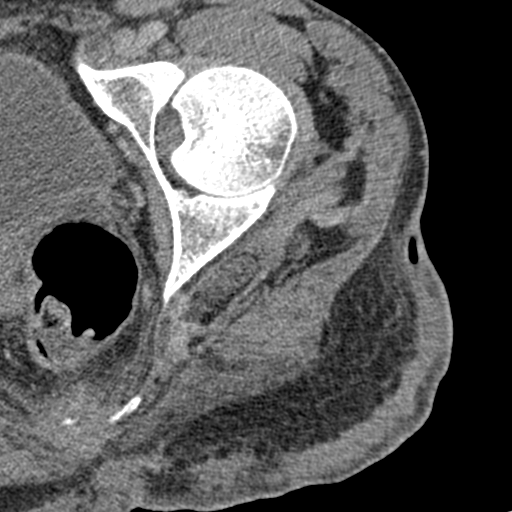

[Series 602: <mpr range> · coronal · 0.76mm/px · 3 of 107 slices shown]
[im 27/107  soft-tissue]
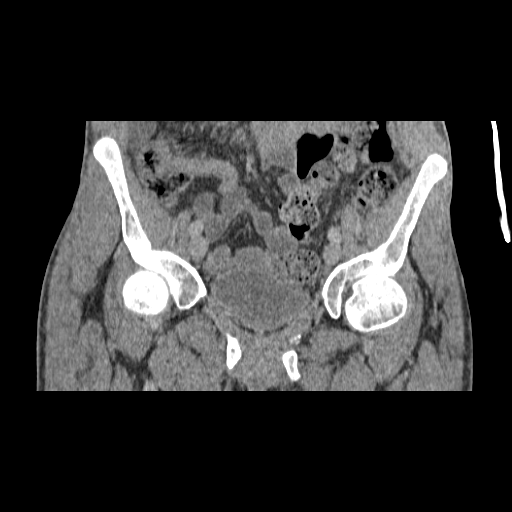
[im 54/107  soft-tissue]
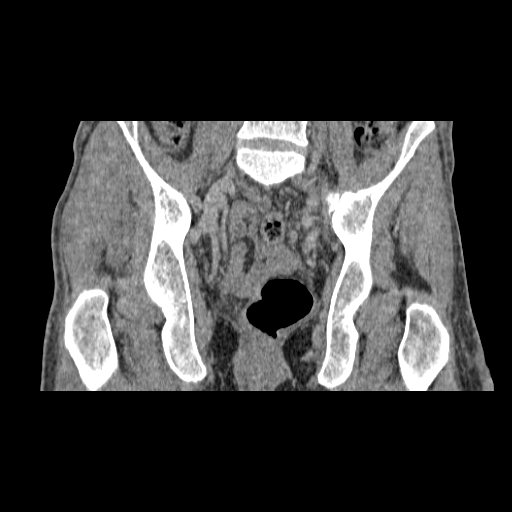
[im 80/107  soft-tissue]
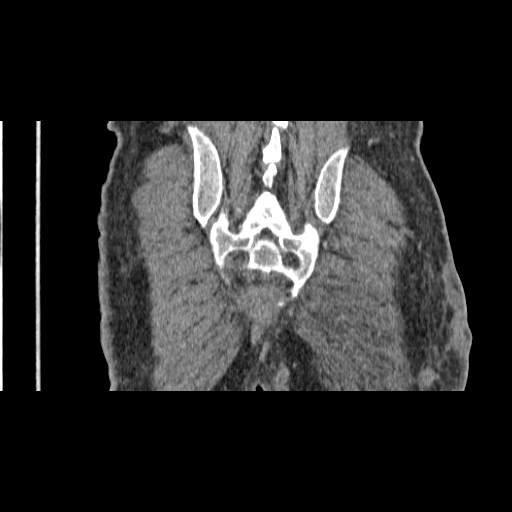

[15 of 46 positions shown; findings below may reference images not displayed]

FINDINGS: A long abnormal band of density in the subcutaneous
tissues posterior to the bony pelvis suggest draining abscess
collection, potentially with some ulceration in the left lateral
buttock region.  There is some relative atrophy of the lower
portion of the left gluteus maximus muscle, and the piriformis
muscles appear symmetric.  However, there is some abnormal
expansion of the left sciatic nerve, possibly from inflammation.

There is destruction or absence of the coccyx, probably from
infection but conceivably from a mass.  In the expected vicinity of
the coccyx there is only a soft tissue density, without overt
pelvic ascites.  No perirectal abscess is identified.  Small
inguinal nodes are probably reactive.

A left common iliac node measures 1.3 cm in short axis dimension.

The obturator internus muscles appear normal and symmetric.

No hip effusion is identified.  There is some expansion of the
subcutaneous tissues on the left side, as well as extension of the
band like subcutaneous opacities along the intra gluteal fold.
IMPRESSION: 1.  Band-like subcutaneous opacities posterior to the pelvis,
probably representing a draining complex of abscesses (draining
along the left posterolateral buttock region).  These band-like
opacities extend along the intra gluteal fold.
2.  There is expansion of the left sciatic nerve which may be due
to inflammation.
3.  Reactive lower pelvic lymph nodes are present.
4.  No definite fluid-filled non draining abscess is identified,
and components of the band-like opacities may represent phlegmon.
5.  Destruction of the coccyx, probably from osteomyelitis, and
less likely from a mass.
6.  Although aggressive therapy is likely warranted in any case, if
the patient has diabetes then very close observation to exclude the
possibility of Minxia Yadee gangrene, which can be fulminant,
would be recommended.

----

CT OF THE LEFT UPPER LEG WITH CONTRAST
FINDINGS: Abnormal skin thickening is present medially along the
left upper thigh and also laterally and posteriorly along the left
upper thigh, possibly with some skin ulceration along the left
posterolateral thigh.

No muscular involvement is identified

The distal superficial femoral node has a short axis diameter of 8
mm, and is likely reactive.

No knee effusion identified.  No compelling findings of femoral
osteomyelitis.
IMPRESSION: 1.  Skin thickening extends along the left upper thigh medially and
skin thickening and surface irregularity along the left
posteromedial upper thigh, probably related to infection and
ulceration.  No drainable abscess is seen.  No osteomyelitis noted.

## 2012-06-13 ENCOUNTER — Emergency Department (INDEPENDENT_AMBULATORY_CARE_PROVIDER_SITE_OTHER)
Admission: EM | Admit: 2012-06-13 | Discharge: 2012-06-13 | Disposition: A | Discharge status: Home / Self Care | Payer: Self-pay | Source: Home / Self Care | Attending: Family Medicine | Admitting: Family Medicine

## 2012-06-13 ENCOUNTER — Encounter (HOSPITAL_COMMUNITY): Payer: Self-pay

## 2012-06-13 DIAGNOSIS — H7291 Unspecified perforation of tympanic membrane, right ear: Secondary | ICD-10-CM

## 2012-06-13 DIAGNOSIS — H7290 Unspecified perforation of tympanic membrane, unspecified ear: Secondary | ICD-10-CM

## 2012-06-13 DIAGNOSIS — L309 Dermatitis, unspecified: Secondary | ICD-10-CM

## 2012-06-13 DIAGNOSIS — H6690 Otitis media, unspecified, unspecified ear: Secondary | ICD-10-CM

## 2012-06-13 DIAGNOSIS — L259 Unspecified contact dermatitis, unspecified cause: Secondary | ICD-10-CM

## 2012-06-13 DIAGNOSIS — H6691 Otitis media, unspecified, right ear: Secondary | ICD-10-CM

## 2012-06-13 HISTORY — DX: Hidradenitis suppurativa: L73.2

## 2012-06-13 MED ORDER — AMOXICILLIN-POT CLAVULANATE 875-125 MG PO TABS: 1.0000 | ORAL_TABLET | Freq: Two times a day (BID) | ORAL | Status: DC

## 2012-06-13 MED ORDER — NEOMYCIN-POLYMYXIN-HC 3.5-10000-1 OT SUSP: 4.0000 [drp] | Freq: Three times a day (TID) | OTIC | Status: DC

## 2012-06-13 NOTE — ED Provider Notes (Signed)
History     CSN: [id number]  Arrival date & time 06/13/12  1449   First MD Initiated Contact with Patient 06/13/12 1528      Chief Complaint  Patient presents with  . Rash    (Consider location/radiation/quality/duration/timing/severity/associated sxs/prior treatment) Patient is a 37 y.o. male presenting with rash and ear drainage. The history is provided by the patient.  Rash Location:  Leg Leg rash location:  R lower leg and L lower leg Quality: itchiness and scaling   Severity:  Mild Progression:  Unchanged Chronicity:  Chronic Relieved by:  Moisturizers Ear Drainage This is a chronic problem. The current episode started more than 1 week ago (over 1 month). The problem has not changed since onset.   Past Medical History  Diagnosis Date  . Hydradenitis     History reviewed. No pertinent past surgical history.  History reviewed. No pertinent family history.  History  Substance Use Topics  . Smoking status: Never Smoker   . Smokeless tobacco: Not on file  . Alcohol Use: Yes      Review of Systems  Constitutional: Negative.   HENT: Positive for ear discharge. Negative for ear pain.   Skin: Positive for rash.    Allergies  Review of patient's allergies indicates no known allergies.  Home Medications   Current Outpatient Rx  Name  Route  Sig  Dispense  Refill  . amoxicillin-clavulanate (AUGMENTIN) 875-125 MG per tablet   Oral   Take 1 tablet by mouth 2 (two) times daily.   20 tablet   0   . doxycycline (VIBRAMYCIN) 100 MG capsule   Oral   Take 100 mg by mouth 2 (two) times daily.         Marland Kitchen neomycin-polymyxin-hydrocortisone (CORTISPORIN) 3.5-10000-1 otic suspension   Right Ear   Place 4 drops into the right ear 3 (three) times daily.   10 mL   1     BP 103/63  Pulse 79  Temp(Src) 98.4 F (36.9 C) (Oral)  SpO2 100%  Physical Exam  Nursing note and vitals reviewed. Constitutional: He appears well-developed and well-nourished.  HENT:    Head: Normocephalic and atraumatic.  Right Ear: There is drainage and swelling. No tenderness. Tympanic membrane is perforated. Tympanic membrane is not bulging.  Left Ear: External ear normal.  Mouth/Throat: Oropharynx is clear and moist.  Skin: Skin is warm and dry. Rash noted.       ED Course  Procedures (including critical care time)  Labs Reviewed - No data to display No results found.   1. Perforated tympanic membrane, right   2. Otitis media of right ear   3. Chronic eczema       MDM          Linna Hoff, MD 06/13/12 [phone]

## 2012-06-13 NOTE — ED Notes (Signed)
C/o rash on legs and ear infection

## 2012-08-24 ENCOUNTER — Encounter (HOSPITAL_COMMUNITY): Payer: Self-pay | Admitting: *Deleted

## 2012-08-24 ENCOUNTER — Emergency Department (HOSPITAL_COMMUNITY)
Admission: EM | Admit: 2012-08-24 | Discharge: 2012-08-24 | Disposition: A | Discharge status: Home / Self Care | Payer: Self-pay | Source: Home / Self Care | Attending: Family Medicine | Admitting: Family Medicine

## 2012-08-24 DIAGNOSIS — H60399 Other infective otitis externa, unspecified ear: Secondary | ICD-10-CM

## 2012-08-24 DIAGNOSIS — H6691 Otitis media, unspecified, right ear: Secondary | ICD-10-CM

## 2012-08-24 DIAGNOSIS — H6690 Otitis media, unspecified, unspecified ear: Secondary | ICD-10-CM

## 2012-08-24 MED ORDER — NEOMYCIN-POLYMYXIN-HC 3.5-10000-1 OT SUSP: 4.0000 [drp] | Freq: Three times a day (TID) | OTIC | Status: DC

## 2012-08-24 MED ORDER — FLUCONAZOLE 150 MG PO TABS: ORAL_TABLET | ORAL | Status: DC

## 2012-08-24 MED ORDER — CIPROFLOXACIN HCL 500 MG PO TABS: 500.0000 mg | ORAL_TABLET | Freq: Two times a day (BID) | ORAL | Status: DC

## 2012-08-24 NOTE — ED Notes (Signed)
C/o R ear pain onset in March and took antibiotics and ear drops but it did not go away.  Did not have insurance or the money for another visit.  R ear started draining onset 2 weeks ago.  States its not really painful just irritating.  Slight decrease in his hearing.

## 2012-08-24 NOTE — ED Provider Notes (Signed)
History     CSN: [id number]  Arrival date & time 08/24/12  1610   First MD Initiated Contact with Patient 08/24/12 1703      Chief Complaint  Patient presents with  . Otalgia    (Consider location/radiation/quality/duration/timing/severity/associated sxs/prior treatment) HPI Comments: 37 year old male with history of being treated for otitis media with TM perforation with Augmentin and Cortisporin almost a month ago. Comes complaining of right ear drainage. Denies pain. Patient states that his symptoms improved but never completely resolved despite finishing his oral antibiotic and otic drops. He started to percent with abundant right ear drainage again 2 weeks ago. Denies fever or chills. No sore throat. No nasal congestion. No headaches. No neck pain. Denies ear pain. Patient does not have insurance.   Past Medical History  Diagnosis Date  . Hydradenitis     History reviewed. No pertinent past surgical history.  History reviewed. No pertinent family history.  History  Substance Use Topics  . Smoking status: Never Smoker   . Smokeless tobacco: Not on file  . Alcohol Use: 4.8 oz/week    4 Glasses of wine, 4 Shots of liquor per week      Review of Systems  Constitutional: Negative for fever and chills.  HENT: Positive for ear discharge. Negative for hearing loss, congestion, rhinorrhea, neck pain and tinnitus.   Respiratory: Negative for cough.   Gastrointestinal: Negative for nausea and vomiting.  Neurological: Negative for dizziness and headaches.  All other systems reviewed and are negative.    Allergies  Review of patient's allergies indicates no known allergies.  Home Medications   Current Outpatient Rx  Name  Route  Sig  Dispense  Refill  . ciprofloxacin (CIPRO) 500 MG tablet   Oral   Take 1 tablet (500 mg total) by mouth 2 (two) times daily.   14 tablet   0   . fluconazole (DIFLUCAN) 150 MG tablet      1 tab po q72 h x3   3 tablet   0   .  neomycin-polymyxin-hydrocortisone (CORTISPORIN) 3.5-10000-1 otic suspension   Right Ear   Place 4 drops into the right ear 3 (three) times daily.   10 mL   0     BP 98/55  Pulse 89  Temp(Src) 98.9 F (37.2 C) (Oral)  Resp 16  SpO2 100%  Physical Exam  Nursing note and vitals reviewed. Constitutional: He is oriented to person, place, and time. He appears well-developed and well-nourished. No distress.  HENT:  Head: Normocephalic and atraumatic.  Mouth/Throat: No oropharyngeal exudate.  Op clear and moist. Nose normal. Right  ear canal with erythema, swelling and liquid yellow exudate with foul smell. There is a small perforation of the TM. TM does no appear red or swollen. No tenderness over mastoid process. Left ear with wax plug but was able to visualize TM appears normal.   Eyes: Conjunctivae and EOM are normal. Pupils are equal, round, and reactive to light. Right eye exhibits no discharge. Left eye exhibits no discharge.  Neck: Neck supple.  Cardiovascular: Normal heart sounds.   Pulmonary/Chest: Breath sounds normal.  Lymphadenopathy:    He has no cervical adenopathy.  Neurological: He is alert and oriented to person, place, and time.  Skin: No rash noted. He is not diaphoretic.    ED Course  Procedures (including critical care time)  Labs Reviewed  CULTURE, ROUTINE-ABSCESS   No results found.   1. Otitis externa of right ear  MDM  Right otitis media with TM perforation. Status post Augmentin and Cortisporin. External ear canal exudate  sample was sent for culture. Concerned for a pseudomonal infection. Prescribed Cipro, Diflucan and Cortisporin.   ENT referral.   supportive care and red flags that should prompt his return medical attention discussed with patient and provided in writing.         Sharin Grave, MD 08/26/12 [phone]

## 2012-08-26 LAB — EAR CULTURE

## 2012-08-28 NOTE — ED Notes (Signed)
Ear culture R ear:  Abundant Diptheroids (Corynebacterium species).  No sensitivity. Pt. treated with Cipro and Cortisporin Otic suspension.  Message sent to Dr. Tressia Danas. Vassie Moselle 08/28/2012

## 2012-08-29 ENCOUNTER — Telehealth (HOSPITAL_COMMUNITY): Payer: Self-pay | Admitting: *Deleted

## 2012-08-29 NOTE — ED Notes (Signed)
I called pt.  Pt. verified x 2 and given results.  I asked how he is doing.  He said he has no pain and the drainage decreased from all day to once a day, just a small amount.  He said he has a few pills left.  I told him the doctor said if it was not better to give him a Rx. Erythromycin.  I told him it was expensive without insurance.  He said his grandmother would get it for him. I told him to call me back if the drainage is not gone when he finishes the antibiotic.  He said he uses [facility] on [address]. Vassie Moselle 08/29/2012

## 2012-08-31 MED ORDER — ERYTHROMYCIN BASE 500 MG PO TABS: 500.0000 mg | ORAL_TABLET | Freq: Three times a day (TID) | ORAL | Status: DC

## 2012-09-01 ENCOUNTER — Telehealth (HOSPITAL_COMMUNITY): Payer: Self-pay | Admitting: *Deleted

## 2012-09-01 NOTE — ED Notes (Signed)
Pt. called back and said he has finished his Cipro.  He said he still has drainage from his ear, but just a small amount now. It was dark green but now it is light green.  I told him that Dr. Tressia Danas discussed it with me yesterday and decided to e-prescribe him Erythromycin to the [facility] at [facility] and [address]. 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  If the Erythromycin does not stop the drainage, he will have to f/u with the ENT.  Pt. voiced understanding. Martin [name] 09/01/2012

## 2012-11-13 ENCOUNTER — Emergency Department (HOSPITAL_COMMUNITY)
Admission: EM | Admit: 2012-11-13 | Discharge: 2012-11-13 | Disposition: A | Discharge status: Home / Self Care | Payer: Self-pay | Attending: Emergency Medicine | Admitting: Emergency Medicine

## 2012-11-13 ENCOUNTER — Encounter (HOSPITAL_COMMUNITY): Payer: Self-pay | Admitting: Emergency Medicine

## 2012-11-13 DIAGNOSIS — R599 Enlarged lymph nodes, unspecified: Secondary | ICD-10-CM | POA: Insufficient documentation

## 2012-11-13 DIAGNOSIS — H9120 Sudden idiopathic hearing loss, unspecified ear: Secondary | ICD-10-CM | POA: Insufficient documentation

## 2012-11-13 DIAGNOSIS — H66019 Acute suppurative otitis media with spontaneous rupture of ear drum, unspecified ear: Secondary | ICD-10-CM | POA: Insufficient documentation

## 2012-11-13 DIAGNOSIS — J029 Acute pharyngitis, unspecified: Secondary | ICD-10-CM | POA: Insufficient documentation

## 2012-11-13 DIAGNOSIS — J3489 Other specified disorders of nose and nasal sinuses: Secondary | ICD-10-CM | POA: Insufficient documentation

## 2012-11-13 MED ORDER — AMOXICILLIN 500 MG PO CAPS: 500.0000 mg | ORAL_CAPSULE | Freq: Three times a day (TID) | ORAL | Status: DC

## 2012-11-13 NOTE — ED Provider Notes (Signed)
Medical screening examination/treatment/procedure(s) were performed by non-physician practitioner and as supervising physician I was immediately available for consultation/collaboration.   Sagrario Lineberry, MD 11/13/12 1457 

## 2012-11-13 NOTE — ED Notes (Signed)
Pt reports one week hx of ear pain

## 2012-11-13 NOTE — ED Provider Notes (Signed)
CSN: [id number]     Arrival date & time 11/13/12  1231 History     First MD Initiated Contact with Patient 11/13/12 1241     Chief Complaint  Patient presents with  . Otalgia    1 week hx of ear pain   (Consider location/radiation/quality/duration/timing/severity/associated sxs/prior Treatment) HPI  Martin [name] is a 37 y.o. male complaining of painless (noted this contradicts triage note) drainage from right ear starting 2 days ago. Patient endorses rhinorrhea, decreased hearing acuity, otalgia that resolved before the drainage started. Patient denies fever, cough, shortness of breath, pharyngitis, tinnitus.   Past Medical History  Diagnosis Date  . Hydradenitis    History reviewed. No pertinent past surgical history. History reviewed. No pertinent family history. History  Substance Use Topics  . Smoking status: Never Smoker   . Smokeless tobacco: Not on file  . Alcohol Use: 4.8 oz/week    4 Glasses of wine, 4 Shots of liquor per week    Review of Systems 10 systems reviewed and found to be negative, except as noted in the HPI   Allergies  Review of patient's allergies indicates no known allergies.  Home Medications   Current Outpatient Rx  Name  Route  Sig  Dispense  Refill  . ciprofloxacin (CIPRO) 500 MG tablet   Oral   Take 1 tablet (500 mg total) by mouth 2 (two) times daily.   14 tablet   0   . erythromycin (E-MYCIN) 500 MG tablet   Oral   Take 1 tablet (500 mg total) by mouth 3 (three) times daily.   21 tablet   0   . neomycin-polymyxin-hydrocortisone (CORTISPORIN) 3.5-10000-1 otic suspension   Right Ear   Place 4 drops into the right ear 3 (three) times daily.   10 mL   0    BP 105/55  Pulse 98  Temp(Src) 98.5 F (36.9 C) (Oral)  SpO2 100% Physical Exam  Nursing note and vitals reviewed. Constitutional: He is oriented to person, place, and time. He appears well-developed and well-nourished. No distress.  HENT:  Head: Normocephalic.   Mouth/Throat: Oropharynx is clear and moist. No oropharyngeal exudate.  Purulent drainage in right outer ear canal, tympanic membrane is not visualized.  Left tympanic membrane shows mild bulging and erythema with blunted light reflex.  Posterior pharynx is mildly injected, there is no tonsillar hypertrophy or exudate.  Eyes: Conjunctivae and EOM are normal. Pupils are equal, round, and reactive to light.  Neck: Normal range of motion.  Shotty anterior cervical lymphadenopathy, nontender.  Cardiovascular: Normal rate and regular rhythm.   Pulmonary/Chest: Effort normal and breath sounds normal. No stridor. No respiratory distress. He has no wheezes. He has no rales. He exhibits no tenderness.  Abdominal: Soft. There is no tenderness.  Musculoskeletal: Normal range of motion.  Neurological: He is alert and oriented to person, place, and time.  Psychiatric: He has a normal mood and affect.    ED Course   Procedures (including critical care time)  Labs Reviewed - No data to display No results found. 1. Otitis media with spontaneous rupture of eardrum, right     MDM   Filed Vitals:   11/13/12 1253  BP: 105/55  Pulse: 98  Temp: 98.5 F (36.9 C)  TempSrc: Oral  SpO2: 100%     Martin [name] is a 37 y.o. male with otitis media and tympanic membrane rupture on the right. Patient does have a history of frequent ear infections. I advised him  to try to keep water out of the ear is much as possible. Advised him also to follow with ENT and audiology.   Pt is hemodynamically stable, appropriate for, and amenable to discharge at this time. Pt verbalized understanding and agrees with care plan. All questions answered. Outpatient follow-up and specific return precautions discussed.    Discharge Medication List as of 11/13/2012  1:30 PM    START taking these medications   Details  amoxicillin (AMOXIL) 500 MG capsule Take 1 capsule (500 mg total) by mouth 3 (three) times daily.,  Starting 11/13/2012, Until Discontinued, Print        Note: Portions of this report may have been transcribed using voice recognition software. Every effort was made to ensure accuracy; however, inadvertent computerized transcription errors may be present    Wynetta Emery, PA-C 11/13/12 1455

## 2013-10-12 ENCOUNTER — Encounter (HOSPITAL_COMMUNITY): Payer: Self-pay | Admitting: Emergency Medicine

## 2013-10-12 ENCOUNTER — Emergency Department (INDEPENDENT_AMBULATORY_CARE_PROVIDER_SITE_OTHER)
Admission: EM | Admit: 2013-10-12 | Discharge: 2013-10-12 | Disposition: A | Discharge status: Home / Self Care | Payer: Self-pay | Source: Home / Self Care

## 2013-10-12 DIAGNOSIS — L080 Pyoderma: Secondary | ICD-10-CM

## 2013-10-12 DIAGNOSIS — Z872 Personal history of diseases of the skin and subcutaneous tissue: Secondary | ICD-10-CM

## 2013-10-12 MED ORDER — CLINDAMYCIN HCL 300 MG PO CAPS: 300.0000 mg | ORAL_CAPSULE | Freq: Three times a day (TID) | ORAL | Status: DC

## 2013-10-12 NOTE — ED Notes (Addendum)
Pt refused dressing. Pt states that he wants the wound to air out. [facility]P

## 2013-10-12 NOTE — ED Notes (Signed)
Abscess on buttocks, history of the same.  Reports abscess getting worse.

## 2013-10-12 NOTE — ED Notes (Signed)
Refused dressing, Martin Rasmussendavid mabe, np aware

## 2013-10-12 NOTE — ED Provider Notes (Signed)
CSN: [id number]     Arrival date & time 10/12/13  1210 History   First MD Initiated Contact with Patient 10/12/13 1258     Chief Complaint  Patient presents with  . Abscess   (Consider location/radiation/quality/duration/timing/severity/associated sxs/prior Treatment) HPI Comments: 38 year old male with a long history of multiple abscesses to the buttocks and left posterior thigh. He said several I&D's leaving scars covering both buttocks as well as the left posterior thigh. He is complaining of small areas of purulent drainage to include the gluteal cleft and 2-3 areas of the lower left buttock and posterior thigh.   Past Medical History  Diagnosis Date  . Hydradenitis    History reviewed. No pertinent past surgical history. No family history on file. History  Substance Use Topics  . Smoking status: Never Smoker   . Smokeless tobacco: Not on file  . Alcohol Use: 4.8 oz/week    4 Glasses of wine, 4 Shots of liquor per week    Review of Systems  Constitutional: Negative.   Skin: Positive for wound.    Allergies  Review of patient's allergies indicates no known allergies.  Home Medications   Prior to Admission medications   Medication Sig Start Date End Date Taking? Authorizing Provider  clindamycin (CLEOCIN) 300 MG capsule Take 1 capsule (300 mg total) by mouth 3 (three) times daily. 10/12/13   Hayden Rasmussenavid Jeyli Zwicker, NP   BP 112/61  Pulse 85  Temp(Src) 98.4 F (36.9 C) (Oral)  Resp 18  SpO2 100% Physical Exam  Nursing note and vitals reviewed. Constitutional: He is oriented to person, place, and time. He appears well-developed and well-nourished. No distress.  Neck: Normal range of motion.  Pulmonary/Chest: Effort normal. No respiratory distress.  Neurological: He is oriented to person, place, and time.  Skin: Skin is warm and dry.  Iliana her percent of the surface area of the left buttock and posterior left thigh is scarred with areas of purulence as described above. There is  currently purulence draining from the gluteal cleft. No abscess can be palpated or visualized. No erythema, lymphangitis or other signs of cellulitis or abscess formation.  Psychiatric: He has a normal mood and affect.    ED Course  Procedures (including critical care time) Labs Review Labs Reviewed  WOUND CULTURE    Imaging Review No results found.   MDM   1. Pyoderma (skin infection)   2. History of abscess of skin and subcutaneous tissue     No signs of abscess formation Wound cult obtained Clindamycin 300 bid Needs PCP     Hayden Rasmussenavid Lorell Thibodaux, NP 10/12/13 1335

## 2013-10-13 NOTE — ED Provider Notes (Signed)
Medical screening examination/treatment/procedure(s) were performed by a resident physician or non-physician practitioner and as the supervising physician I was immediately available for consultation/collaboration.  Evan Corey, MD    Evan S Corey, MD 10/13/13 1715 

## 2013-10-14 LAB — WOUND CULTURE

## 2013-10-16 NOTE — ED Notes (Signed)
Wound culture: Few Strep G.  Pt. adequately treated with Cleocin. Vassie MoselleYork, Sadako Cegielski M 10/16/2013

## 2013-10-25 ENCOUNTER — Ambulatory Visit (INDEPENDENT_AMBULATORY_CARE_PROVIDER_SITE_OTHER): Payer: Self-pay | Admitting: Physician Assistant

## 2013-10-25 ENCOUNTER — Encounter: Payer: Self-pay | Admitting: Physician Assistant

## 2013-10-25 VITALS — BP 96/54 | HR 90 | Temp 98.4°F | Resp 16 | Ht 71.25 in | Wt 186.8 lb

## 2013-10-25 DIAGNOSIS — L732 Hidradenitis suppurativa: Secondary | ICD-10-CM

## 2013-10-25 MED ORDER — HYDROCODONE-ACETAMINOPHEN 10-325 MG PO TABS: 1.0000 | ORAL_TABLET | Freq: Three times a day (TID) | ORAL | Status: DC | PRN

## 2013-10-25 MED ORDER — DOXYCYCLINE HYCLATE 100 MG PO TABS
100.0000 mg | ORAL_TABLET | Freq: Two times a day (BID) | ORAL | Status: DC
Start: 2013-10-25 — End: 2013-10-26

## 2013-10-25 NOTE — Progress Notes (Signed)
Pre visit review using our clinic review tool, if applicable. No additional management support is needed unless otherwise documented below in the visit note/SLS  

## 2013-10-25 NOTE — Progress Notes (Signed)
Patient presents to clinic today to establish care.  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  Patient states he was recently in the ER for acute abscess and drainage.  Was given prescription for Bactrim that endorses taking with resolution of drainage.  Is looking for ongoing treatment for his condition.  Has previously been seen at [facility] but has not followed up in a couple of years.  Patient denies fever, chills or malaise.  Denies current area of concern.  Endorses some mild, chronic serous drainage from healing wound openings.  Endorses family history of hidradenitis suppurativa.   Health Maintenance: Dental -- overdue Vision -- overdue Immunizations -- Unsure of last tetanus; Over 10 years.  Colonoscopy -- 2013; no abnormalities.  Past Medical History  Diagnosis Date  . Hydradenitis   . Abscess of buttock   . Chicken pox   . Abscess of thigh s/p I&D in past 10/26/2013    Past Surgical History  Procedure Laterality Date  . Unremarkable.    . Abcess drainage      No current outpatient prescriptions on file prior to visit.   No current facility-administered medications on file prior to visit.    No Known Allergies  Family History  Problem Relation Age of Onset  . Healthy Mother     Living  . Diabetes Maternal Grandmother   . Hypertension Maternal Grandfather   . Emphysema Maternal Uncle     x2-Deceased  . Other Sister     Hydradenitis    History   Social History  . Marital Status: Single    Spouse Name: N/A    Number of Children: N/A  . Years of Education: N/A   Occupational History  . Not on file.   Social History Main Topics  . Smoking status: Never Smoker   . Smokeless tobacco: Never Used  . Alcohol Use: 4.8 oz/week    4 Glasses of wine, 4 Shots of liquor per week  . Drug Use: No  . Sexual Activity: No     Comment: sexually active with men previously   Other  Topics Concern  . Not on file   Social History Narrative  . No narrative on file   Review of Systems  Constitutional: Negative for fever, chills and malaise/fatigue.  HENT: Negative for ear discharge, ear pain, hearing loss and tinnitus.   Eyes: Negative for blurred vision and double vision.  Respiratory: Negative for cough and shortness of breath.   Cardiovascular: Negative for chest pain.  Gastrointestinal: Negative.   Genitourinary: Negative.   Neurological: Negative for dizziness, loss of consciousness and headaches.  Psychiatric/Behavioral: Negative for depression, suicidal ideas, hallucinations and substance abuse. The patient is not nervous/anxious and does not have insomnia.     BP 96/54  Pulse 90  Temp(Src) 98.4 F (36.9 C) (Oral)  Resp 16  Ht 5' 11.25" (1.81 m)  Wt 186 lb 12 oz (84.709 kg)  BMI 25.86 kg/m2  SpO2 98%  Physical Exam  Vitals reviewed. Constitutional: He is oriented to person, place, and time and well-developed, well-nourished, and in no distress.  HENT:  Head: Normocephalic and atraumatic.  Right Ear: External ear normal.  Left Ear: External ear normal.  Nose: Nose normal.  Mouth/Throat: Oropharynx is clear and moist. No oropharyngeal exudate.  TM within normal limits bilaterally.  Neck: Neck supple. No thyromegaly present.  Cardiovascular: Normal rate, regular rhythm, normal heart sounds and intact distal  pulses.   Pulmonary/Chest: Effort normal and breath sounds normal. No respiratory distress. He has no wheezes. He has no rales. He exhibits no tenderness.  Lymphadenopathy:    He has no cervical adenopathy.  Neurological: He is alert and oriented to person, place, and time.  Skin:     Psychiatric: Affect normal.    Recent Results (from the past 2160 hour(s))  WOUND CULTURE     Status: None   Collection Time    10/12/13  1:26 PM      Result Value Ref Range   Specimen Description WOUND BUTTOCKS     Special Requests GLUTEAL CLEFT      Gram Stain       Value: MODERATE WBC PRESENT,BOTH PMN AND MONONUCLEAR     NO SQUAMOUS EPITHELIAL CELLS SEEN     RARE GRAM POSITIVE COCCI     IN PAIRS     [facility]   Culture       Value: FEW STREPTOCOCCUS GROUP G     Performed at [facility]   Report Status 10/14/2013 FINAL      Assessment/Plan: Hidradenitis suppurativa of trunk, genetalia, thighs - EXTENSIVE Extensive disease.  Likely with very significant tracking of sinuses.  Rx Doxycycline.  Urgent referral placed to General surgery for evaluation.

## 2013-10-25 NOTE — Patient Instructions (Signed)
Please take medications as directed.   Keep area clean and dry.  You will be contacted by Surgery for evaluation.  I have made this an urgent referral so they should work you in within the next week.  If you do not hear from them within 24 hours, please give me a call.  If you develop any fever, chills or worsening pain, please return to the office.

## 2013-10-26 ENCOUNTER — Encounter (INDEPENDENT_AMBULATORY_CARE_PROVIDER_SITE_OTHER): Payer: Self-pay | Admitting: Surgery

## 2013-10-26 ENCOUNTER — Ambulatory Visit (INDEPENDENT_AMBULATORY_CARE_PROVIDER_SITE_OTHER): Payer: Self-pay | Admitting: Surgery

## 2013-10-26 VITALS — BP 122/68 | HR 78 | Temp 97.5°F | Ht 72.0 in | Wt 189.0 lb

## 2013-10-26 DIAGNOSIS — L732 Hidradenitis suppurativa: Secondary | ICD-10-CM

## 2013-10-26 DIAGNOSIS — L02419 Cutaneous abscess of limb, unspecified: Secondary | ICD-10-CM

## 2013-10-26 HISTORY — DX: Cutaneous abscess of limb, unspecified: L02.419

## 2013-10-26 MED ORDER — DOXYCYCLINE HYCLATE 100 MG PO TABS: 100.0000 mg | ORAL_TABLET | Freq: Two times a day (BID) | ORAL | Status: AC

## 2013-10-26 NOTE — Patient Instructions (Signed)
Please consider the recommendations that we have given you today:  [facility] with [facility] reconstructive surgery & dermatology for medical, antibiotic, surgical options to help get this severe disease under better control.  Please call our office at [phone] if you have further questions / concerns.    Hidradenitis Suppurativa, Sweat Gland Abscess Hidradenitis suppurativa is a long lasting (chronic), uncommon disease of the sweat glands. With this, boil-like lumps and scarring develop in the groin, some times under the arms (axillae), and under the breasts. It may also uncommonly occur behind the ears, in the crease of the buttocks, and around the genitals.  CAUSES  The cause is from a blocking of the sweat glands. They then become infected. It may cause drainage and odor. It is not contagious. So it cannot be given to someone else. It most often shows up in puberty (about 1910 to 36 years of age). But it may happen much later. It is similar to acne which is a disease of the sweat glands. This condition is slightly more common in African-Americans and women. SYMPTOMS   Hidradenitis usually starts as one or more red, tender, swellings in the groin or under the arms (axilla).  Over a period of hours to days the lesions get larger. They often open to the skin surface, draining clear to yellow-colored fluid.  The infected area heals with scarring. DIAGNOSIS  Your caregiver makes this diagnosis by looking at you. Sometimes cultures (growing germs on plates in the lab) may be taken. This is to see what germ (bacterium) is causing the infection.  TREATMENT   Topical germ killing medicine applied to the skin (antibiotics) are the treatment of choice. Antibiotics taken by mouth (systemic) are sometimes needed when the condition is getting worse or is severe.  Avoid tight-fitting clothing which traps moisture in.  Dirt does not cause hidradenitis and it is  not caused by poor hygiene.  Involved areas should be cleaned daily using an antibacterial soap. Some patients find that the liquid form of Lever 2000, applied to the involved areas as a lotion after bathing, can help reduce the odor related to this condition.  Sometimes surgery is needed to drain infected areas or remove scarred tissue. Removal of large amounts of tissue is used only in severe cases.  Birth control pills may be helpful.  Oral retinoids (vitamin A derivatives) for 6 to 12 months which are effective for acne may also help this condition.  Weight loss will improve but not cure hidradenitis. It is made worse by being overweight. But the condition is not caused by being overweight.  This condition is more common in people who have had acne.  It may become worse under stress. There is no medical cure for hidradenitis. It can be controlled, but not cured. The condition usually continues for years with periods of getting worse and getting better (remission). Document Released: 11/12/2003 Document Revised: 06/22/2011 Document Reviewed: 11/28/2007 [facility] Patient Information 2015 [employer] [place]. This information is not intended to replace advice given to you by your health care provider. Make sure you discuss any questions you have with your health care provider.

## 2013-10-26 NOTE — Progress Notes (Signed)
Subjective:     Patient ID: Martin [name], male   DOB: January 05, 1976, 38 y.o.   MRN: [id number]  HPI  Note: Portions of this report may have been transcribed using voice recognition software. Every effort was made to ensure accuracy; however, inadvertent computerized transcription errors may be present.   Any transcriptional errors that result from this process are unintentional.            Martin [name]  12/29/1975 [id number]  Patient Care Team: Martin Climes, PA-C as PCP - General (Physician Assistant) Martin Bush, MD as Consulting Physician (Dermatology) Martin Macho, MD as Consulting Physician (Hematology and Oncology)  This patient is a 38 y.o.male who presents today for surgical evaluation at the request of self.   Reason for visit: Drainage around the buttocks and thigh.  History of hydradenitis.  Young male.  HIV negative.  Nonsmoker.  He has struggled with chronic sinuses in his groins thighs and buttocks.  An incision and drainage of axillary abscess in the distant past as well.  Last seen by her group in 2012 when he was admitted for anemia and cellulitis.  Plastic surgical consultation made.  Recommendation made to follow up at Martin [name].  Patient considered a medication trial to help treat his severe hydradenitis.  In the end declined.  Became incarcerated.  Lost to followup.  He struggles with chronic draining sinuses.  Improved with antibiotics intermittently.  Martin [name] emergency room a lot this month.  Wish to be seen by surgery.  He does not have a specific painful area but does have a lot of chronic drainage especially in the left groin and between the buttocks.  Has daily bowel movements.  Recently started on doxycycline.  Feels a little better.  Patient Active Problem List   Diagnosis Date Noted  . Abscess of thigh s/p I&D in past 10/26/2013  . Hidradenitis suppurativa of trunk, genetalia, thighs - EXTENSIVE 10/26/2013    Past Medical History    Diagnosis Date  . Hydradenitis   . Abscess of buttock   . Chicken pox   . Abscess of thigh s/p I&D in past 10/26/2013    Past Surgical History  Procedure Laterality Date  . Unremarkable.    . Abcess drainage      History   Social History  . Marital Status: Single    Spouse Name: N/A    Number of Children: N/A  . Years of Education: N/A   Occupational History  . Not on file.   Social History Main Topics  . Smoking status: Never Smoker   . Smokeless tobacco: Never Used  . Alcohol Use: 4.8 oz/week    4 Glasses of wine, 4 Shots of liquor per week  . Drug Use: No  . Sexual Activity: No     Comment: sexually active with men previously   Martin Topics Concern  . Not on file   Social History Narrative  . No narrative on file    Family History  Problem Relation Age of Onset  . Martin [name]     Living  . Martin [name]   . Martin [name]   . Martin [name]     x2-Deceased  . Martin [name]     Hydradenitis    Current Outpatient Prescriptions  Medication Sig Dispense Refill  . doxycycline (VIBRA-TABS) 100 MG tablet Take 1 tablet (100 mg total) by mouth 2 (two) times daily.  20 tablet  0  . HYDROcodone-acetaminophen (NORCO)  10-325 MG per tablet Take 1 tablet by mouth every 8 (eight) hours as needed.  30 tablet  0   No current facility-administered medications for this visit.     No Known Allergies  BP 122/68  Pulse 78  Temp(Src) 97.5 F (36.4 C)  Ht 6' (1.829 m)  Wt 189 lb (85.73 kg)  BMI 25.63 kg/m2  No results found.   Review of Systems  Constitutional: Negative for fever, chills and diaphoresis.  HENT: Negative for sore throat and trouble swallowing.   Eyes: Negative for photophobia and visual disturbance.  Respiratory: Negative for choking and shortness of breath.   Cardiovascular: Negative for chest pain and palpitations.  Gastrointestinal: Negative for nausea, vomiting, abdominal distention, anal  bleeding and rectal pain.  Genitourinary: Negative for dysuria, urgency, difficulty urinating and testicular pain.  Musculoskeletal: Negative for arthralgias, gait problem, myalgias and neck pain.  Skin: Positive for wound. Negative for color change and rash.  Neurological: Negative for dizziness, speech difficulty, weakness and numbness.  Hematological: Negative for adenopathy.  Psychiatric/Behavioral: Negative for hallucinations, confusion and agitation.       Objective:   Physical Exam  Constitutional: He is oriented to person, place, and time. He appears well-developed and well-nourished. No distress.  HENT:  Head: Normocephalic.  Mouth/Throat: Oropharynx is clear and moist. No oropharyngeal exudate.  Eyes: Conjunctivae and EOM are normal. Pupils are equal, round, and reactive to light. No scleral icterus.  Neck: Normal range of motion. Neck supple. No tracheal deviation present.  Cardiovascular: Normal rate, regular rhythm and intact distal pulses.   Pulmonary/Chest: Effort normal and breath sounds normal. No respiratory distress.  Abdominal: Soft. He exhibits no distension. There is no tenderness. Hernia confirmed negative in the right inguinal area and confirmed negative in the left inguinal area.  Genitourinary:     Musculoskeletal: Normal range of motion. He exhibits no tenderness.       Legs: Lymphadenopathy:    He has no cervical adenopathy.       Right: No inguinal adenopathy present.       Left: No inguinal adenopathy present.  Neurological: He is alert and oriented to person, place, and time. No cranial nerve deficit. He exhibits normal muscle tone. Coordination normal.  Skin: Skin is warm and dry. No rash noted. He is not diaphoretic. No erythema. No pallor.  Psychiatric: He has a normal mood and affect. His behavior is normal. Judgment and thought content normal.       Assessment:     Severe chronic hydradenitis suppurativa     Plan:     I spent 45 minutes  reviewing chart examination and getting internal second/third opinions.  Very complicated wounds.  This is too massive and complex to corrcect with a few incision and drainage.  I did offer to drain a few bothersome areas near the intergluteal cleft and lateral thigh.  He wanted to hold off.  I did packing a lot of undermining sinuses in left lateral thigh & left inner groin.  I strongly recommend he stay on antibiotics for at least 6 weeks.  Doxycycline.  I strongly recommended reevaluation with plastic reconstructive surgery at a major academic institution.  [facility].  I do feel he needs a combination of antibiotics, immunomodulators, and then eventually surgery.  I have a strong feeling he would need wide excisions with skin grafting to help get his disease under better control.  Get a consistent followup plan.

## 2013-10-27 NOTE — Assessment & Plan Note (Signed)
Extensive disease.  Likely with very significant tracking of sinuses.  Rx Doxycycline.  Urgent referral placed to General surgery for evaluation.

## 2013-11-03 ENCOUNTER — Telehealth (INDEPENDENT_AMBULATORY_CARE_PROVIDER_SITE_OTHER): Payer: Self-pay

## 2013-11-03 DIAGNOSIS — L732 Hidradenitis suppurativa: Secondary | ICD-10-CM

## 2013-11-03 NOTE — Telephone Encounter (Signed)
Pl[facility] in epic and placed in Jennifer's tray in nursing office.

## 2014-04-06 ENCOUNTER — Emergency Department (HOSPITAL_BASED_OUTPATIENT_CLINIC_OR_DEPARTMENT_OTHER)
Admission: EM | Admit: 2014-04-06 | Discharge: 2014-04-06 | Disposition: A | Discharge status: Home / Self Care | Payer: Self-pay | Attending: Emergency Medicine | Admitting: Emergency Medicine

## 2014-04-06 ENCOUNTER — Encounter (HOSPITAL_BASED_OUTPATIENT_CLINIC_OR_DEPARTMENT_OTHER): Payer: Self-pay

## 2014-04-06 DIAGNOSIS — Z872 Personal history of diseases of the skin and subcutaneous tissue: Secondary | ICD-10-CM | POA: Insufficient documentation

## 2014-04-06 DIAGNOSIS — B0052 Herpesviral keratitis: Secondary | ICD-10-CM | POA: Insufficient documentation

## 2014-04-06 MED ORDER — CYCLOPENTOLATE HCL 1 % OP SOLN
1.0000 [drp] | Freq: Once | OPHTHALMIC | Status: AC
  Administered 2014-04-06: 1 [drp] via OPHTHALMIC
  Filled 2014-04-06: qty 2

## 2014-04-06 MED ORDER — GANCICLOVIR 0.15 % OP GEL: 1.0000 [drp] | Freq: Every day | OPHTHALMIC | Status: DC

## 2014-04-06 MED ORDER — FLUORESCEIN SODIUM 1 MG OP STRP
ORAL_STRIP | OPHTHALMIC | Status: AC
  Filled 2014-04-06: qty 1

## 2014-04-06 MED ORDER — OXYCODONE-ACETAMINOPHEN 5-325 MG PO TABS: 1.0000 | ORAL_TABLET | ORAL | Status: DC | PRN

## 2014-04-06 MED ORDER — FLUORESCEIN SODIUM 1 MG OP STRP: 1.0000 | ORAL_STRIP | Freq: Once | OPHTHALMIC | Status: DC

## 2014-04-06 NOTE — Discharge Instructions (Signed)
Call the ophthalmologist today so you can be seen promptly. She may want to see you during the weekend.  Herpes Keratitis The term "keratitis" refers to an inflammation of the cornea (clear covering at the front of the eye). When you look at the color of a person's eye, you are looking through the clear cornea to the colored iris, which is inside the eye. The cornea is an extremely sensitive tissue. This is why you immediately blink when something touches your eye, or even if you think something is going to touch your eye.  One of the most common forms of keratitis is produced by the herpes virus. There are different types of herpes infections. Herpes Simplex Virus 1 (HSV-1) usually causes cold sores and eye infections. HSV-2 is usually, but not always, the cause of sexually transmitted herpes infections. There is another type of herpes virus called Herpes Zoster, which is the cause of a painful rash known as "shingles." When shingles affects the face, there may be an associated herpes infection or inflammation in the eye on the same side as the rash. This makes it very important to have your eye checked, but this is typically not a herpes keratitis infection.  Most people, at one time or another, have had some form of herpes virus in their system. Stress, fatigue, sunlight, surgery, illness, the use of certain drugs like steroids, and certain foods are all factors that may make the herpes virus flare up again, causing a herpes-related illness. One of the places that the virus can cause inflammation is on the surface of the cornea. For this reason, if you have an active herpes infection, such as a cold sore, it is very important to avoid contact to your eyes with your fingers, since the virus may spread. SYMPTOMS  Herpes keratitis almost always occurs in only one eye at a time. Herpes keratitis causes:  Pain in the affected eye.  Extreme light sensitivity.  Eye is typically red and inflamed.  Vision  may be blurred.  Eye may tear excessively. DIAGNOSIS  Herpes Keratitis has a very specific pattern of inflammation on the cornea. It is so typical that an eye specialist can tell almost immediately what is wrong, by simply looking at the eye with a special microscope and with a small amount of special green dye placed in the eye. Herpes keratitis forms a branch-like pattern of inflammation known as a "dendrite." For this reason, it is also called "dendritic keratitis." RELATED COMPLICATIONS Without treatment, herpes keratitis can cause scarring of the cornea and loss of vision. It can come back even after it has been successfully treated. This usually happens during a generalized illness, when all herpes viruses are prone to flare up. The inflammation can also spread to the inside of the eye, causing scarring. The side effects of such scarring can result in complications and such conditions as glaucoma and cataracts. It is very important to know exactly what type of keratitis you have, and to know the cause of any red eye. This is because certain medicines, which are commonly used as eye drops, can make a herpes keratitis infection much worse very fast. Steroid drops (prednisone) for instance, can rapidly make a herpes infection worse, if used at the wrong time, while the virus is still active. TREATMENT  Medicines are available for the treatment of herpes keratitis. The medicines used will depend on how much of the eye is involved. It may also depend on whether there is live virus still  in the eye.  Your caregiver may want you to have a complete physical examination, to be sure that nothing else is going on in your body that allowed the herpes virus to flare up. Even after successful treatment, one third of people with herpes virus will have a recurrence.  HOME CARE INSTRUCTIONS   Take all medicines as instructed. Take pain medicines only as prescribed by your caregiver. Do not self medicate with eye  drops, unless instructed to do so.  Keep all appointments as instructed. Remember, this illness is a leading cause of blindness. MAKE SURE YOU:   Understand these instructions.  Will watch your condition.  Will get help right away if you are not doing well or get worse. Document Released: 12/23/2000 Document Revised: 06/22/2011 Document Reviewed: 02/18/2009 [facility] Patient Information 2015 [place], [place]. This information is not intended to replace advice given to you by your health care provider. Make sure you discuss any questions you have with your health care provider.  Ganciclovir ophthalmic gel What is this medicine? GANCICLOVIR (gan SYE kloe veer) is an antiviral medicine. It is used to treat herpes infection in the eye. This medicine may be used for other purposes; ask your health care provider or pharmacist if you have questions. COMMON BRAND NAME(S): Zirgan What should I tell my health care provider before I take this medicine? They need to know if you have any of these conditions: -contact lens wearer -an unusual or allergic reaction to ganciclovir, acyclovir, other medicines, foods, dyes, or preservatives -pregnant or trying to get pregnant -breast-feeding How should I use this medicine? This medicine is only for use in the eye. Follow the directions on the prescription label. Wash hands before and after use. Try not to touch the tip of the dropper to anything, even your eye or fingertips. Tilt your head back slightly and pull your lower eyelid down with your index finger to form a pouch. Squeeze the prescribed number of drops into the pouch. Close the eye gently to spread the drops. Your vision may blur for a few minutes. Use your doses at regular intervals. Do not use your medicine more often than directed. Finish the full course that is prescribed even if you think your condition is better. Do not skip doses or stop your medicine early. Talk to your pediatrician regarding  the use of this medicine in children. While this drug may be prescribed for children as young as 2 years for selected conditions, precautions do apply. Overdosage: If you think you've taken too much of this medicine contact a poison control center or emergency room at once. Overdosage: If you think you have taken too much of this medicine contact a poison control center or emergency room at once. NOTE: This medicine is only for you. Do not share this medicine with others. What if I miss a dose? If you miss a dose, use it as soon as you can. If it is almost time for your next dose, use only that dose. Do not use double or extra doses. What may interact with this medicine? Interactions are not expected. Do not use any other eye products without telling your doctor or health care professional. This list may not describe all possible interactions. Give your health care provider a list of all the medicines, herbs, non-prescription drugs, or dietary supplements you use. Also tell them if you smoke, drink alcohol, or use illegal drugs. Some items may interact with your medicine. What should I watch for while using  this medicine? Tell your doctor or health care professional if your symptoms do not improve in 2 to 3 days or if they get worse. Do not wear contact lenses while you have any signs or symptoms of an eye infection. Ask your doctor or health care professional when you can start wearing your contacts again. Stop using this medicine immediately if you notice signs of an allergic reaction. To prevent the spread of infection, do not share eye products, towels, and washcloths with anyone else. What side effects may I notice from receiving this medicine? Side effects that you should report to your doctor or health care professional as soon as possible: -allergic reactions like skin rash, itching or hives, swelling of the face, lips, or tongue Side effects that usually do not require medical attention  (Report these to your doctor or health care professional if they continue or are bothersome.): -blurred vision -eye irritation, itching -mild burning, redness or stinging in the eye This list may not describe all possible side effects. Call your doctor for medical advice about side effects. You may report side effects to FDA at 1-800-FDA-1088. Where should I keep my medicine? Keep out of the reach of children. Store between 15 and 25 degrees C (59 and 77 degrees F). Do not freeze. Throw away any unused medicine after the expiration date. NOTE: This sheet is a summary. It may not cover all possible information. If you have questions about this medicine, talk to your doctor, pharmacist, or health care provider.  2015, Elsevier/Gold Standard. (2008-01-03 10:34:29)  Acetaminophen; Oxycodone tablets What is this medicine? ACETAMINOPHEN; OXYCODONE (a set a MEE noe fen; ox i KOE done) is a pain reliever. It is used to treat mild to moderate pain. This medicine may be used for other purposes; ask your health care provider or pharmacist if you have questions. COMMON BRAND NAME(S): Endocet, Magnacet, Narvox, Percocet, Perloxx, Primalev, Primlev, Roxicet, Xolox What should I tell my health care provider before I take this medicine? They need to know if you have any of these conditions: -brain tumor -Crohn's disease, inflammatory bowel disease, or ulcerative colitis -drug abuse or addiction -head injury -heart or circulation problems -if you often drink alcohol -kidney disease or problems going to the bathroom -liver disease -lung disease, asthma, or breathing problems -an unusual or allergic reaction to acetaminophen, oxycodone, other opioid analgesics, other medicines, foods, dyes, or preservatives -pregnant or trying to get pregnant -breast-feeding How should I use this medicine? Take this medicine by mouth with a full glass of water. Follow the directions on the prescription label. Take your  medicine at regular intervals. Do not take your medicine more often than directed. Talk to your pediatrician regarding the use of this medicine in children. Special care may be needed. Patients over 16 years old may have a stronger reaction and need a smaller dose. Overdosage: If you think you have taken too much of this medicine contact a poison control center or emergency room at once. NOTE: This medicine is only for you. Do not share this medicine with others. What if I miss a dose? If you miss a dose, take it as soon as you can. If it is almost time for your next dose, take only that dose. Do not take double or extra doses. What may interact with this medicine? -alcohol -antihistamines -barbiturates like amobarbital, butalbital, butabarbital, methohexital, pentobarbital, phenobarbital, thiopental, and secobarbital -benztropine -drugs for bladder problems like solifenacin, trospium, oxybutynin, tolterodine, hyoscyamine, and methscopolamine -drugs for breathing problems  like ipratropium and tiotropium -drugs for certain stomach or intestine problems like propantheline, homatropine methylbromide, glycopyrrolate, atropine, belladonna, and dicyclomine -general anesthetics like etomidate, ketamine, nitrous oxide, propofol, desflurane, enflurane, halothane, isoflurane, and sevoflurane -medicines for depression, anxiety, or psychotic disturbances -medicines for sleep -muscle relaxants -naltrexone -narcotic medicines (opiates) for pain -phenothiazines like perphenazine, thioridazine, chlorpromazine, mesoridazine, fluphenazine, prochlorperazine, promazine, and trifluoperazine -scopolamine -tramadol -trihexyphenidyl This list may not describe all possible interactions. Give your health care provider a list of all the medicines, herbs, non-prescription drugs, or dietary supplements you use. Also tell them if you smoke, drink alcohol, or use illegal drugs. Some items may interact with your  medicine. What should I watch for while using this medicine? Tell your doctor or health care professional if your pain does not go away, if it gets worse, or if you have new or a different type of pain. You may develop tolerance to the medicine. Tolerance means that you will need a higher dose of the medication for pain relief. Tolerance is normal and is expected if you take this medicine for a long time. Do not suddenly stop taking your medicine because you may develop a severe reaction. Your body becomes used to the medicine. This does NOT mean you are addicted. Addiction is a behavior related to getting and using a drug for a non-medical reason. If you have pain, you have a medical reason to take pain medicine. Your doctor will tell you how much medicine to take. If your doctor wants you to stop the medicine, the dose will be slowly lowered over time to avoid any side effects. You may get drowsy or dizzy. Do not drive, use machinery, or do anything that needs mental alertness until you know how this medicine affects you. Do not stand or sit up quickly, especially if you are an older patient. This reduces the risk of dizzy or fainting spells. Alcohol may interfere with the effect of this medicine. Avoid alcoholic drinks. There are different types of narcotic medicines (opiates) for pain. If you take more than one type at the same time, you may have more side effects. Give your health care provider a list of all medicines you use. Your doctor will tell you how much medicine to take. Do not take more medicine than directed. Call emergency for help if you have problems breathing. The medicine will cause constipation. Try to have a bowel movement at least every 2 to 3 days. If you do not have a bowel movement for 3 days, call your doctor or health care professional. Do not take Tylenol (acetaminophen) or medicines that have acetaminophen with this medicine. Too much acetaminophen can be very dangerous. Many  nonprescription medicines contain acetaminophen. Always read the labels carefully to avoid taking more acetaminophen. What side effects may I notice from receiving this medicine? Side effects that you should report to your doctor or health care professional as soon as possible: -allergic reactions like skin rash, itching or hives, swelling of the face, lips, or tongue -breathing difficulties, wheezing -confusion -light headedness or fainting spells -severe stomach pain -unusually weak or tired -yellowing of the skin or the whites of the eyes Side effects that usually do not require medical attention (report to your doctor or health care professional if they continue or are bothersome): -dizziness -drowsiness -nausea -vomiting This list may not describe all possible side effects. Call your doctor for medical advice about side effects. You may report side effects to FDA at 1-800-FDA-1088. Where should I keep  my medicine? Keep out of the reach of children. This medicine can be abused. Keep your medicine in a safe place to protect it from theft. Do not share this medicine with anyone. Selling or giving away this medicine is dangerous and against the law. Store at room temperature between 20 and 25 degrees C (68 and 77 degrees F). Keep container tightly closed. Protect from light. This medicine may cause accidental overdose and death if it is taken by other adults, children, or pets. Flush any unused medicine down the toilet to reduce the chance of harm. Do not use the medicine after the expiration date. NOTE: This sheet is a summary. It may not cover all possible information. If you have questions about this medicine, talk to your doctor, pharmacist, or health care provider.  2015, Elsevier/Gold Standard. (2012-11-21 13:17:35)

## 2014-04-06 NOTE — ED Provider Notes (Signed)
CSN: [id number]     Arrival date & time 04/06/14  0117 History   First MD Initiated Contact with Patient 04/06/14 0345     Chief Complaint  Patient presents with  . Eye Drainage     (Consider location/radiation/quality/duration/timing/severity/associated sxs/prior Treatment) The history is provided by the patient.   38 year old male comes in with two-day history of irritation of the left eye with clear drainage. He has noticed that it is very sensitive to light and it is painful in his eyes open and light. He has been keeping the eye closes much as possible. He has not been able to tell if vision has been blurred because he keeps his eye closed. He denies any, denies any exposure to anyone with pinkeye. He has not treated it with anything. He denies any trauma.  Past Medical History  Diagnosis Date  . Hydradenitis   . Abscess of buttock   . Chicken pox   . Abscess of thigh s/p I&D in past 10/26/2013   Past Surgical History  Procedure Laterality Date  . Unremarkable.    . Abcess drainage     Family History  Problem Relation Age of Onset  . Healthy Mother     Living  . Diabetes Maternal Grandmother   . Hypertension Maternal Grandfather   . Emphysema Maternal Uncle     x2-Deceased  . Other Sister     Hydradenitis   History  Substance Use Topics  . Smoking status: Never Smoker   . Smokeless tobacco: Never Used  . Alcohol Use: 4.8 oz/week    4 Glasses of wine, 4 Shots of liquor per week    Review of Systems  All other systems reviewed and are negative.     Allergies  Review of patient's allergies indicates no known allergies.  Home Medications   Prior to Admission medications   Medication Sig Start Date End Date Taking? Authorizing Provider  HYDROcodone-acetaminophen (NORCO) 10-325 MG per tablet Take 1 tablet by mouth every 8 (eight) hours as needed. 10/25/13   Waldon MerlWilliam C Martin, PA-C   BP 102/49 mmHg  Pulse 102  Temp(Src) 99.4 F (37.4 C) (Oral)  Resp 16  Ht  6\' 1"  (1.854 m)  Wt 175 lb (79.379 kg)  BMI 23.09 kg/m2  SpO2 99% Physical Exam  Nursing note and vitals reviewed.  38 year old male, resting comfortably and in no acute distress. Vital signs are significant for borderline tachycardia. Oxygen saturation is 99%, which is normal. Head is normocephalic and atraumatic. PERRLA, EOMI. Oropharynx is clear. There is mild erythema of the conjunctiva of the left eye. There is marked pain with shining light directly and the left eye and there is mild pain with shining light in his right eye. Neck is nontender and supple without adenopathy or JVD. Back is nontender and there is no CVA tenderness. Lungs are clear without rales, wheezes, or rhonchi. Chest is nontender. Heart has regular rate and rhythm without murmur. Abdomen is soft, flat, nontender without masses or hepatosplenomegaly and peristalsis is normoactive. Extremities have no cyanosis or edema, full range of motion is present. Skin is warm and dry without rash. Neurologic: Mental status is normal, cranial nerves are intact, there are no motor or sensory deficits.  ED Course  Procedures (including critical care time) Slit-lamp exam shows corneal irregularity in the temporal portion of the cornea. No cells in the anterior chamber and no cell flare. No foreign body. I was stained with flow seen and examined with cobalt  blue filter showing dendritic ulcer in the temporal portion of the left cornea consistent with herpes keratitis.  MDM   Final diagnoses:  Herpes simplex keratitis, dendritic    Left eye redness and pain, possible iritis.  Slit-lamp examination shows herpes keratitis. He is given a single dose of cyclopentolate and is discharged with prescription for ganciclovir and is given a prescription for oxycodone-acetaminophen for pain. Is referred to ophthalmology for follow-up. Importance of prompt follow-up to preserve vision was stressed to the patient.    Dione Boozeavid Dyani Babel,  MD 04/06/14 [phone]

## 2014-04-06 NOTE — ED Notes (Signed)
Pt c/o lt eye drainage/redness/sensitive to light x2 days

## 2014-08-24 ENCOUNTER — Encounter (HOSPITAL_BASED_OUTPATIENT_CLINIC_OR_DEPARTMENT_OTHER): Payer: Self-pay

## 2014-08-24 ENCOUNTER — Inpatient Hospital Stay (HOSPITAL_BASED_OUTPATIENT_CLINIC_OR_DEPARTMENT_OTHER)
Admission: EM | Admit: 2014-08-24 | Discharge: 2014-08-26 | DRG: 872 | Disposition: A | Discharge status: Home / Self Care | Payer: Self-pay | Attending: Internal Medicine | Admitting: Internal Medicine

## 2014-08-24 DIAGNOSIS — L03116 Cellulitis of left lower limb: Secondary | ICD-10-CM

## 2014-08-24 DIAGNOSIS — L0390 Cellulitis, unspecified: Secondary | ICD-10-CM | POA: Diagnosis present

## 2014-08-24 DIAGNOSIS — L02416 Cutaneous abscess of left lower limb: Secondary | ICD-10-CM | POA: Diagnosis present

## 2014-08-24 DIAGNOSIS — L732 Hidradenitis suppurativa: Secondary | ICD-10-CM | POA: Diagnosis present

## 2014-08-24 DIAGNOSIS — A419 Sepsis, unspecified organism: Principal | ICD-10-CM | POA: Diagnosis present

## 2014-08-24 DIAGNOSIS — D509 Iron deficiency anemia, unspecified: Secondary | ICD-10-CM | POA: Diagnosis present

## 2014-08-24 DIAGNOSIS — D649 Anemia, unspecified: Secondary | ICD-10-CM | POA: Diagnosis present

## 2014-08-24 DIAGNOSIS — H6091 Unspecified otitis externa, right ear: Secondary | ICD-10-CM

## 2014-08-24 DIAGNOSIS — D638 Anemia in other chronic diseases classified elsewhere: Secondary | ICD-10-CM | POA: Diagnosis present

## 2014-08-24 HISTORY — DX: Anemia, unspecified: D64.9

## 2014-08-24 HISTORY — DX: Encounter for other specified aftercare: Z51.89

## 2014-08-24 LAB — CBC
HCT: 21.3 % — ABNORMAL LOW (ref 39.0–52.0)
HEMOGLOBIN: 6.1 g/dL — AB (ref 13.0–17.0)
MCH: 18.8 pg — ABNORMAL LOW (ref 26.0–34.0)
MCHC: 28.6 g/dL — AB (ref 30.0–36.0)
MCV: 65.7 fL — ABNORMAL LOW (ref 78.0–100.0)
Platelets: 418 10*3/uL — ABNORMAL HIGH (ref 150–400)
RBC: 3.24 MIL/uL — AB (ref 4.22–5.81)
RDW: 18.3 % — ABNORMAL HIGH (ref 11.5–15.5)
WBC: 10.2 10*3/uL (ref 4.0–10.5)

## 2014-08-24 LAB — I-STAT CG4 LACTIC ACID, ED
LACTIC ACID, VENOUS: 2.59 mmol/L — AB (ref 0.5–2.0)
LACTIC ACID, VENOUS: 0.75 mmol/L (ref 0.5–2.0)

## 2014-08-24 LAB — CBC WITH DIFFERENTIAL/PLATELET
BASOS ABS: 0 10*3/uL (ref 0.0–0.1)
BASOS PCT: 0 % (ref 0–1)
Eosinophils Absolute: 0.6 10*3/uL (ref 0.0–0.7)
Eosinophils Relative: 7 % — ABNORMAL HIGH (ref 0–5)
HCT: 24.6 % — ABNORMAL LOW (ref 39.0–52.0)
Hemoglobin: 7.1 g/dL — ABNORMAL LOW (ref 13.0–17.0)
LYMPHS ABS: 1.4 10*3/uL (ref 0.7–4.0)
Lymphocytes Relative: 17 % (ref 12–46)
MCH: 19.1 pg — ABNORMAL LOW (ref 26.0–34.0)
MCHC: 28.9 g/dL — ABNORMAL LOW (ref 30.0–36.0)
MCV: 66.1 fL — ABNORMAL LOW (ref 78.0–100.0)
MONOS PCT: 6 % (ref 3–12)
Monocytes Absolute: 0.5 10*3/uL (ref 0.1–1.0)
NEUTROS ABS: 5.6 10*3/uL (ref 1.7–7.7)
Neutrophils Relative %: 70 % (ref 43–77)
PLATELETS: 273 10*3/uL (ref 150–400)
RBC: 3.72 MIL/uL — ABNORMAL LOW (ref 4.22–5.81)
RDW: 18.5 % — AB (ref 11.5–15.5)
WBC: 8.1 10*3/uL (ref 4.0–10.5)

## 2014-08-24 LAB — PREPARE RBC (CROSSMATCH)

## 2014-08-24 LAB — BASIC METABOLIC PANEL
ANION GAP: 8 (ref 5–15)
BUN: 9 mg/dL (ref 6–20)
CHLORIDE: 101 mmol/L (ref 101–111)
CO2: 24 mmol/L (ref 22–32)
CREATININE: 0.91 mg/dL (ref 0.61–1.24)
Calcium: 8.2 mg/dL — ABNORMAL LOW (ref 8.9–10.3)
GFR calc Af Amer: >60 mL/min (ref >60)
GFR calc non Af Amer: >60 mL/min (ref >60)
GLUCOSE: 88 mg/dL (ref 65–99)
Potassium: 4.1 mmol/L (ref 3.5–5.1)
SODIUM: 133 mmol/L — AB (ref 135–145)

## 2014-08-24 LAB — CREATININE, SERUM
CREATININE: 0.9 mg/dL (ref 0.61–1.24)
GFR calc Af Amer: >60 mL/min (ref >60)

## 2014-08-24 MED ORDER — SODIUM CHLORIDE 0.9 % IV BOLUS (SEPSIS)
1000.0000 mL | Freq: Once | INTRAVENOUS | Status: AC
  Administered 2014-08-24: 1000 mL via INTRAVENOUS

## 2014-08-24 MED ORDER — ONDANSETRON HCL 4 MG PO TABS: 4.0000 mg | ORAL_TABLET | Freq: Four times a day (QID) | ORAL | Status: DC | PRN

## 2014-08-24 MED ORDER — PIPERACILLIN-TAZOBACTAM 3.375 G IVPB 30 MIN
3.3750 g | Freq: Once | INTRAVENOUS | Status: DC
  Filled 2014-08-24: qty 50

## 2014-08-24 MED ORDER — VANCOMYCIN HCL 10 G IV SOLR
1500.0000 mg | Freq: Two times a day (BID) | INTRAVENOUS | Status: DC
  Administered 2014-08-25 – 2014-08-26 (×3): 1500 mg via INTRAVENOUS
  Filled 2014-08-24 (×4): qty 1500

## 2014-08-24 MED ORDER — VANCOMYCIN HCL IN DEXTROSE 1-5 GM/200ML-% IV SOLN
1000.0000 mg | Freq: Once | INTRAVENOUS | Status: AC
  Administered 2014-08-24: 1000 mg via INTRAVENOUS
  Filled 2014-08-24: qty 200

## 2014-08-24 MED ORDER — ENOXAPARIN SODIUM 40 MG/0.4ML ~~LOC~~ SOLN
40.0000 mg | SUBCUTANEOUS | Status: DC
  Administered 2014-08-24 – 2014-08-25 (×2): 40 mg via SUBCUTANEOUS
  Filled 2014-08-24 (×3): qty 0.4

## 2014-08-24 MED ORDER — ACETAMINOPHEN 325 MG PO TABS
650.0000 mg | ORAL_TABLET | Freq: Four times a day (QID) | ORAL | Status: DC | PRN
  Administered 2014-08-24: 650 mg via ORAL
  Filled 2014-08-24: qty 2

## 2014-08-24 MED ORDER — SODIUM CHLORIDE 0.9 % IV SOLN
Freq: Once | INTRAVENOUS | Status: AC
  Administered 2014-08-25: via INTRAVENOUS

## 2014-08-24 MED ORDER — VANCOMYCIN HCL 500 MG IV SOLR
500.0000 mg | INTRAVENOUS | Status: AC
  Administered 2014-08-24: 500 mg via INTRAVENOUS
  Filled 2014-08-24: qty 500

## 2014-08-24 MED ORDER — SODIUM CHLORIDE 0.9 % IV SOLN
INTRAVENOUS | Status: AC
  Administered 2014-08-24: 21:00:00 via INTRAVENOUS

## 2014-08-24 MED ORDER — ACETAMINOPHEN 650 MG RE SUPP: 650.0000 mg | Freq: Four times a day (QID) | RECTAL | Status: DC | PRN

## 2014-08-24 MED ORDER — ONDANSETRON HCL 4 MG/2ML IJ SOLN: 4.0000 mg | Freq: Four times a day (QID) | INTRAMUSCULAR | Status: DC | PRN

## 2014-08-24 MED ORDER — PIPERACILLIN-TAZOBACTAM 3.375 G IVPB
3.3750 g | Freq: Three times a day (TID) | INTRAVENOUS | Status: DC
  Administered 2014-08-24 – 2014-08-26 (×5): 3.375 g via INTRAVENOUS
  Filled 2014-08-24 (×7): qty 50

## 2014-08-24 MED ORDER — PIPERACILLIN-TAZOBACTAM IN DEX 2-0.25 GM/50ML IV SOLN
2.2500 g | Freq: Once | INTRAVENOUS | Status: DC
  Filled 2014-08-24: qty 50

## 2014-08-24 NOTE — Progress Notes (Signed)
Rashida Green from lab called about critical Hemoglobin of 6.1. On-call clinician notified and 1 unit of PRBC ordered.

## 2014-08-24 NOTE — ED Notes (Signed)
Pt reports 3-4 day history of questionable LLE "spider bite" - area swollen, darkened skin tone, dry, scaly areas of skin - pt also reports right ear drainage x4 days. Reports he is on doxycycline BID for 2 weeks for surgery in June to treat multiple abscesses.

## 2014-08-24 NOTE — ED Provider Notes (Signed)
CSN: [id number]     Arrival date & time 08/24/14  1222 History   First MD Initiated Contact with Patient 08/24/14 1244     Chief Complaint  Patient presents with  . Wound Check     (Consider location/radiation/quality/duration/timing/severity/associated sxs/prior Treatment) HPI Martin [name] is a 39 y.o. male with history of hidradenitis, ruptured TM, comes in for evaluation of spider bite and ear drainage. Patient states approximately for 5 days ago he thinks he may have been bitten by a spider as he saw one on his bed, but denies seeing any spiders on his skin, but notes a wound to the back of his left calf that is draining and is painful. He also notes ear drainage from his right ear and also began for 5 days ago. He has been packing his ear canal with napkins to try to keep it dry, but this has not worked. He reports currently being on doxycycline for hidradenitis, prescribed to him by physicians [facility]. States that he will have surgery in June to help his hidradenitis. Denies any fevers, chills, shortness of breath or chest pain, nausea or vomiting, numbness or weakness. No other aggravating or modifying factors.  Past Medical History  Diagnosis Date  . Hydradenitis   . Abscess of buttock   . Chicken pox   . Abscess of thigh s/p I&D in past 10/26/2013   Past Surgical History  Procedure Laterality Date  . Unremarkable.    . Abcess drainage     Family History  Problem Relation Age of Onset  . Healthy Mother     Living  . Diabetes Maternal Grandmother   . Hypertension Maternal Grandfather   . Emphysema Maternal Uncle     x2-Deceased  . Other Sister     Hydradenitis   History  Substance Use Topics  . Smoking status: Never Smoker   . Smokeless tobacco: Never Used  . Alcohol Use: 4.8 oz/week    4 Glasses of wine, 4 Shots of liquor per week     Comment: weekly    Review of Systems A 10 point review of systems was completed and was negative except for pertinent  positives and negatives as mentioned in the history of present illness     Allergies  Review of patient's allergies indicates no known allergies.  Home Medications   Prior to Admission medications   Medication Sig Start Date End Date Taking? Authorizing Provider  DOXYCYCLINE PO Take by mouth.   Yes Historical Provider, MD  Ganciclovir (ZIRGAN) 0.15 % GEL Place 1 drop into the left eye 5 (five) times daily. 04/06/14   Dione Boozeavid Glick, MD  HYDROcodone-acetaminophen [facility](NORCO) 10-325 MG per tablet Take 1 tablet by mouth every 8 (eight) hours as needed. 10/25/13   Waldon MerlWilliam C Martin, PA-C  oxyCODONE-acetaminophen (PERCOCET) 5-325 MG per tablet Take 1 tablet by mouth every 4 (four) hours as needed for moderate pain. 04/06/14   Dione Boozeavid Glick, MD   BP 103/52 mmHg  Pulse 99  Temp(Src) 98.5 F (36.9 C) (Oral)  Resp 18  Ht 6\' 1"  (1.854 m)  Wt 180 lb (81.647 kg)  BMI 23.75 kg/m2  SpO2 100% Physical Exam  Constitutional: He is oriented to person, place, and time. He appears well-developed and well-nourished.  HENT:  Head: Normocephalic and atraumatic.  Mouth/Throat: Oropharynx is clear and moist.  Left TM normal with appropriate light reflex. Right external auditory canal with yellow mucopurulent material, unable to visualize TM  Eyes: Conjunctivae are normal. Pupils  are equal, round, and reactive to light. Right eye exhibits no discharge. Left eye exhibits no discharge. No scleral icterus.  Neck: Neck supple.  Cardiovascular: Normal rate, regular rhythm and normal heart sounds.   Pulmonary/Chest: Effort normal and breath sounds normal. No respiratory distress. He has no wheezes. He has no rales.  Abdominal: Soft. There is no tenderness.  Musculoskeletal: He exhibits no tenderness.  Neurological: He is alert and oriented to person, place, and time.  Cranial Nerves II-XII grossly intact  Skin: Skin is warm and dry. No rash noted.  Open, draining lesion noted to posterior aspect of left mid calf.  Clear, yellow drainage. Surrounding cellulitis present. Muscle compartments are soft. There is also significant annular scaling and hyperpigmentation diffusely throughout bilateral lower extremities. Patient reports hyperpigmentation and mild scaling is baseline, but left lower extremity has increased scaling.  Psychiatric: He has a normal mood and affect.  Nursing note and vitals reviewed.   ED Course  Procedures (including critical care time) Labs Review Labs Reviewed  CBC WITH DIFFERENTIAL/PLATELET - Abnormal; Notable for the following:    RBC 3.72 (*)    Hemoglobin 7.1 (*)    HCT 24.6 (*)    MCV 66.1 (*)    MCH 19.1 (*)    MCHC 28.9 (*)    RDW 18.5 (*)    Eosinophils Relative 7 (*)    All other components within normal limits  BASIC METABOLIC PANEL - Abnormal; Notable for the following:    Sodium 133 (*)    Calcium 8.2 (*)    All other components within normal limits  I-STAT CG4 LACTIC ACID, ED - Abnormal; Notable for the following:    Lactic Acid, Venous 2.59 (*)    All other components within normal limits  WOUND CULTURE  WOUND CULTURE  I-STAT CG4 LACTIC ACID, ED    Imaging Review No results found.   EKG Interpretation None     Meds given in ED:  Medications  vancomycin (VANCOCIN) IVPB 1000 mg/200 mL premix (1,000 mg Intravenous New Bag/Given 08/24/14 1652)  piperacillin-tazobactam (ZOSYN) IVPB 3.375 g (not administered)  sodium chloride 0.9 % bolus 1,000 mL (0 mLs Intravenous Stopped 08/24/14 1417)  sodium chloride 0.9 % bolus 1,000 mL (0 mLs Intravenous Stopped 08/24/14 1450)    New Prescriptions   No medications on file   Filed Vitals:   08/24/14 1500 08/24/14 1530 08/24/14 1600 08/24/14 1630  BP: 106/55 104/55 104/47 103/52  Pulse: 86 90 85 99  Temp:      TempSrc:      Resp:      Height:      Weight:      SpO2: 100% 100% 100% 100%    MDM  Vitals stable and patient remains afebrile. Has received 3 boluses of normal saline and heart rate has been  steadily improving.  Pt resting comfortably in ED. PE--gross cellulitis to left Posterior Leg. Spontaneously Draining Abscess. Labwork--patient labs appear to be at baseline, chronic anemia noted. Lactic acid 2.59  Patient with gross cellulitis and spontaneously draining abscess to left lower extremity has been on doxycycline for the past 5 days. Remains afebrile in the ED, however will need to be admitted for IV antibiotics as patient has failed outpatient therapy. Wound culture obtained from leg and ear. Patient started on vancomycin and Zosyn. I discussed and reviewed this case with attending, Dr. Madilyn Hook who also saw and evaluated the patient and agrees with plan for admission. Consult internal medicine, spoke with Dr.  Elmahi, patient admitted to Med-Surg. Patient will be transported via [facility]. Patient is stable and appropriate for transportation I discussed all relevant lab findings and imaging results with pt and they verbalized understanding. Discussed f/u with PCP within 48 hrs and return precautions, pt very amenable to plan.  Final diagnoses:  Cellulitis of left lower extremity  Otitis externa, right        Joycie PeekBenjamin Dayami Taitt, PA-C 08/24/14 1724  Tilden FossaElizabeth Rees, MD 08/27/14 [id number]

## 2014-08-24 NOTE — H&P (Addendum)
[facility] History and Physical  Martin Constantathaniel T Meech ZOX:[id number] DOB: [birth year] DOA: 08/24/2014  Referring physician: Patient was transferred from [facility] [place]. PCP: Piedad ClimesMartin, William Cody, PA-C  Specialists: Patient follows at [facility] for his hydradenitis.  Chief Complaint: Left calf pain and swelling.  HPI: Martin [name] is a 39 y.o. male with history of severe hidradenitis is being followed at [facility] and was recently placed on doxycycline last week in anticipation of possible surgical procedure for his lesions in the groin, started experiencing pain and swelling in his left calf over the last 3-4 days and had started developing discharge today morning. Patient discharges purulent. Patient's lactic acid initially was elevated and patient was tachycardic and was given fluid bolus following which his lactate has improved and patient has been admitted for IV antibiotics. On my exam patient is not in distress and his pain has improved. Denies any nausea or vomiting chest pain shortness of breath or diarrhea. Patient has chronic anemia.   Review of Systems: As presented in the history of presenting illness, rest negative.  Past Medical History  Diagnosis Date  . Hydradenitis   . Abscess of buttock   . Chicken pox   . Abscess of thigh s/p I&D in past 10/26/2013  . Blood transfusion without reported diagnosis   . Anemia    Past Surgical History  Procedure Laterality Date  . Unremarkable.    . Abcess drainage     Social History:  reports that he has never smoked. He has never used smokeless tobacco. He reports that he drinks about 4.8 oz of alcohol per week. He reports that he does not use illicit drugs. Where does patient live home. Can patient participate in ADLs? Yes.  No Known Allergies  Family History:  Family History  Problem Relation Age of Onset  . Healthy Mother     Living  . Diabetes Maternal Grandmother   . Hypertension Maternal  Grandfather   . Emphysema Maternal Uncle     x2-Deceased  . Other Sister     Hydradenitis      Prior to Admission medications   Medication Sig Start Date End Date Taking? Authorizing Provider  DOXYCYCLINE PO Take by mouth.   Yes Historical Provider, MD  Ganciclovir (ZIRGAN) 0.15 % GEL Place 1 drop into the left eye 5 (five) times daily. 04/06/14   Dione Boozeavid Glick, MD  HYDROcodone-acetaminophen [facility](NORCO) 10-325 MG per tablet Take 1 tablet by mouth every 8 (eight) hours as needed. 10/25/13   Waldon MerlWilliam C Martin, PA-C  oxyCODONE-acetaminophen (PERCOCET) 5-325 MG per tablet Take 1 tablet by mouth every 4 (four) hours as needed for moderate pain. 04/06/14   Dione Boozeavid Glick, MD    Physical Exam: Filed Vitals:   08/24/14 1630 08/24/14 1700 08/24/14 1730 08/24/14 1854  BP: 103/52 107/53 93/65 108/54  Pulse: 99 95 94 104  Temp:    99 F (37.2 C)  TempSrc:    Oral  Resp:    18  Height:    6\' 1"  (1.854 m)  Weight:    76.703 kg (169 lb 1.6 oz)  SpO2: 100% 100% 100% 100%     General:  Well-developed and well-nourished.  Eyes: Anicteric mild pallor.  ENT: No discharge from the ears eyes nose or mouth.  Neck: No mass felt.  Cardiovascular: S1 and S2 heard.  Respiratory: No rhonchi or crepitations.  Abdomen: Soft nontender bowel sounds present.  Skin: Patient has multiple areas of skin lesions due  to hydradenitis. Patient's probably involve the groin area back and one involving his axilla has healed. There is a lesion on his left calf with mild drainage.  Musculoskeletal: Mild swelling and drainage from his left calf area.  Psychiatric: Appears normal.  Neurologic: Alert awake oriented to time place and person. Moves all extremities.  Labs on Admission:  Basic Metabolic Panel:  Recent Labs Lab 08/24/14 1310  NA 133*  K 4.1  CL 101  CO2 24  GLUCOSE 88  BUN 9  CREATININE 0.91  CALCIUM 8.2*   Liver Function Tests: No results for input(s): AST, ALT, ALKPHOS, BILITOT, PROT, ALBUMIN  in the last 168 hours. No results for input(s): LIPASE, AMYLASE in the last 168 hours. No results for input(s): AMMONIA in the last 168 hours. CBC:  Recent Labs Lab 08/24/14 1310  WBC 8.1  NEUTROABS 5.6  HGB 7.1*  HCT 24.6*  MCV 66.1*  PLT 273   Cardiac Enzymes: No results for input(s): CKTOTAL, CKMB, CKMBINDEX, TROPONINI in the last 168 hours.  BNP (last 3 results) No results for input(s): BNP in the last 8760 hours.  ProBNP (last 3 results) No results for input(s): PROBNP in the last 8760 hours.  CBG: No results for input(s): GLUCAP in the last 168 hours.  Radiological Exams on Admission: No results found.   Assessment/Plan Principal Problem:   Sepsis Active Problems:   Hidradenitis suppurativa of trunk, genetalia, thighs - EXTENSIVE   Cellulitis   Chronic anemia   Cellulitis of left lower extremity   1. Sepsis from cellulitis and abscess of his left calf - presently the lesion is draining. Continue with empiric antibiotics. Follow wound cultures. Continue gentle hydration. We discuss with surgery in a.m. 2. Chronic anemia, microcytic hypochromic - repeat CBC. Check anemia panel. There is further drop in hemoglobin may need transfusion. Patient has had EGD and colonoscopy in 2012 which were unremarkable patient's anemia was attributed to chronic disease. 3. History of hydradenitis suppurativa - patient is being followed at [facility] and is originally supposed to be having surgery planned eventually coming weeks.   DVT Prophylaxis Lovenox.  Code Status: Full code.  Family Communication: Family at the bedside.  Disposition Plan: Admit to inpatient.    Yilin Weedon N. [facility] Pager [phone].  If 7PM-7AM, please contact night-coverage [email or web address] Password [email or web address] 08/24/2014, 8:29 PM

## 2014-08-24 NOTE — Progress Notes (Signed)
Pt admitted to the unit at 1852. Pt mental status is alert and oriented. Pt oriented to room, staff, and call bell. Pt noted to have dry flaky skin to lower extremities, states he has really bad eczema. Area noted to left lower leg on calf that pt states is a spider bite. Area is warm to touch and swollen. Area noted to pt left upper thigh with a dressing he placed. Stated he had a procedure done here in the hospital to this site. Refused assessment because he did not want to take underwear off. Passed this information on to oncoming nurse. Full assessment charted in [facility]. Call bell within reach.

## 2014-08-24 NOTE — Progress Notes (Signed)
ANTIBIOTIC CONSULT NOTE - INITIAL  Pharmacy Consult for vancomycin, Zosyn Indication: Gross cellulitis and draining abscess to left-LE  No Known Allergies  Patient Measurements: Height: 6\' 1"  (185.4 cm) Weight: 169 lb 1.6 oz (76.703 kg) IBW/kg (Calculated) : 79.9 Vital Signs: Temp: 99 F (37.2 C) (05/13 1854) Temp Source: Oral (05/13 1854) BP: 108/54 mmHg (05/13 1854) Pulse Rate: 104 (05/13 1854) Intake/Output from previous day:   Intake/Output from this shift:    Labs:  Recent Labs  08/24/14 1310  WBC 8.1  HGB 7.1*  PLT 273  CREATININE 0.91   Estimated Creatinine Clearance: 121.7 mL/min (by C-G formula based on Cr of 0.91). No results for input(s): VANCOTROUGH, VANCOPEAK, VANCORANDOM, GENTTROUGH, GENTPEAK, GENTRANDOM, TOBRATROUGH, TOBRAPEAK, TOBRARND, AMIKACINPEAK, AMIKACINTROU, AMIKACIN in the last 72 hours.   Microbiology: No results found for this or any previous visit (from the past 720 hour(s)).  Medical History: Past Medical History  Diagnosis Date  . Hydradenitis   . Abscess of buttock   . Chicken pox   . Abscess of thigh s/p I&D in past 10/26/2013  . Blood transfusion without reported diagnosis   . Anemia    Assessment: 39 year old male with history hydradenitis previously on doxycycline as outpatient now admitted with gross cellulitis and draining abscess of left- lower extremity to start vancomycin and Zosyn. Patient has received 1g of vancomycin at 1652PM.   SCr 0.91 with estimated CrCl ~121.7 mL/min. WBC 8.1, Tm 99.  Wound cultures pending.   Goal of Therapy:  Vancomycin trough level 10-15 mcg/ml  Plan:  Zosyn 3.375g IV every 8 hours- each dose give over 4 hr infusion. Vancomycin 500mg  now (in addition to prior 1g) then 1500mg  IV every 12 hours.  Monitor renal function, culture results, and clinical status.   Link SnufferJessica Woodson Macha, PharmD, BCPS Clinical Pharmacist [phone] 08/24/2014,8:40 PM

## 2014-08-24 NOTE — ED Notes (Signed)
Lactic Acid Reults 2.59 shown to General MillsBenjamin Cartner, GeorgiaPA.

## 2014-08-25 DIAGNOSIS — D638 Anemia in other chronic diseases classified elsewhere: Secondary | ICD-10-CM | POA: Diagnosis present

## 2014-08-25 LAB — CBC WITH DIFFERENTIAL/PLATELET
BASOS ABS: 0 10*3/uL (ref 0.0–0.1)
Basophils Relative: 0 % (ref 0–1)
Eosinophils Absolute: 0.5 10*3/uL (ref 0.0–0.7)
Eosinophils Relative: 6 % — ABNORMAL HIGH (ref 0–5)
HCT: 21.1 % — ABNORMAL LOW (ref 39.0–52.0)
Hemoglobin: 6.1 g/dL — CL (ref 13.0–17.0)
Lymphocytes Relative: 18 % (ref 12–46)
Lymphs Abs: 1.6 10*3/uL (ref 0.7–4.0)
MCH: 19.4 pg — ABNORMAL LOW (ref 26.0–34.0)
MCHC: 28.9 g/dL — ABNORMAL LOW (ref 30.0–36.0)
MCV: 67 fL — ABNORMAL LOW (ref 78.0–100.0)
MONO ABS: 0.5 10*3/uL (ref 0.1–1.0)
Monocytes Relative: 6 % (ref 3–12)
NEUTROS PCT: 70 % (ref 43–77)
Neutro Abs: 6.3 10*3/uL (ref 1.7–7.7)
PLATELETS: 431 10*3/uL — AB (ref 150–400)
RBC: 3.15 MIL/uL — ABNORMAL LOW (ref 4.22–5.81)
RDW: 19.5 % — AB (ref 11.5–15.5)
WBC: 8.9 10*3/uL (ref 4.0–10.5)

## 2014-08-25 LAB — BASIC METABOLIC PANEL
Anion gap: 6 (ref 5–15)
BUN: 6 mg/dL (ref 6–20)
CO2: 25 mmol/L (ref 22–32)
Calcium: 7.6 mg/dL — ABNORMAL LOW (ref 8.9–10.3)
Chloride: 102 mmol/L (ref 101–111)
Creatinine, Ser: 0.89 mg/dL (ref 0.61–1.24)
GFR calc Af Amer: >60 mL/min (ref >60)
GFR calc non Af Amer: >60 mL/min (ref >60)
Glucose, Bld: 97 mg/dL (ref 65–99)
Potassium: 3.7 mmol/L (ref 3.5–5.1)
Sodium: 133 mmol/L — ABNORMAL LOW (ref 135–145)

## 2014-08-25 LAB — FOLATE: Folate: 9.9 ng/mL (ref >5.9)

## 2014-08-25 LAB — RETICULOCYTES
RBC.: 3.15 MIL/uL — AB (ref 4.22–5.81)
RETIC COUNT ABSOLUTE: 47.3 10*3/uL (ref 19.0–186.0)
Retic Ct Pct: 1.5 % (ref 0.4–3.1)

## 2014-08-25 LAB — IRON AND TIBC
Iron: 73 ug/dL (ref 45–182)
SATURATION RATIOS: 38 % (ref 17.9–39.5)
TIBC: 193 ug/dL — AB (ref 250–450)
UIBC: 120 ug/dL

## 2014-08-25 LAB — PREPARE RBC (CROSSMATCH)

## 2014-08-25 LAB — VITAMIN B12: VITAMIN B 12: 347 pg/mL (ref 180–914)

## 2014-08-25 LAB — LACTIC ACID, PLASMA: Lactic Acid, Venous: 0.8 mmol/L (ref 0.5–2.0)

## 2014-08-25 LAB — FERRITIN: FERRITIN: 30 ng/mL (ref 24–336)

## 2014-08-25 MED ORDER — SODIUM CHLORIDE 0.9 % IV SOLN
Freq: Once | INTRAVENOUS | Status: AC
  Administered 2014-08-25: 07:00:00 via INTRAVENOUS

## 2014-08-25 NOTE — Progress Notes (Signed)
Utilization review completed.  

## 2014-08-25 NOTE — Progress Notes (Signed)
On-call clinician paged about patient's morning hemoglobin of 6.1 post one unit transfusion of PRBC. Claiborne BillingsCallahan stated he would put in for another unit of blood.

## 2014-08-25 NOTE — Progress Notes (Signed)
[facility] PROGRESS NOTE  Martin [name] [id number] DOB: Jan 22, 1976 DOA: 08/24/2014 PCP: Piedad ClimesMartin, William Cody, PA-C  Assessment/Plan:  Principal Problem:   Sepsis: Blood pressure improved. Active Problems: Draining abscess of left calf with surrounding cellulitis: Swelling pain improved. Draining spontaneously. Will avoid i and d as long as continues to drain and improved. Needs warm compresses and twice a day showers. Improving on current antibiotics. Culture pending.   Hidradenitis suppurativa of trunk, genetalia, thighs - EXTENSIVE:   Chronic anemia, chronic disease. Status post 2 units packed red blood cells. Will repeat CBC in the morning.   Cellulitis of left lower extremity  HPI/Subjective: Leg pain better. No vomiting or diarrhea. Has not been out of bed much.  Objective: Filed Vitals:   08/25/14 1312  BP: 103/55  Pulse: 88  Temp: 98.5 F (36.9 C)  Resp: 18    Intake/Output Summary (Last 24 hours) at 08/25/14 1434 Last data filed at 08/25/14 1230  Gross per 24 hour  Intake   1995 ml  Output   1800 ml  Net    195 ml   Filed Weights   08/24/14 1235 08/24/14 1854  Weight: 81.647 kg (180 lb) 76.703 kg (169 lb 1.6 oz)    Exam:   General:  Watching TV, talking on the phone. Appears comfortable and nontoxic.  Cardiovascular:  Regular rate rhythm without murmurs gallops rubs  Respiratory: Clear to auscultation bilaterally without wheezes rhonchi or rales   Abdomen: Soft nontender nondistended Ext: Left calf with several punctate areas draining. On material. No fluctuance noted. Minimal surrounding cellulitis. Bilateral buttock/hip area with chronic draining wounds.   Basic Metabolic Panel:  Recent Labs Lab 08/24/14 1310 08/24/14 2048 08/25/14 0418  NA 133*  --  133*  K 4.1  --  3.7  CL 101  --  102  CO2 24  --  25  GLUCOSE 88  --  97  BUN 9  --  6  CREATININE 0.91 0.90 0.89  CALCIUM 8.2*  --  7.6*   Liver Function Tests: No results  for input(s): AST, ALT, ALKPHOS, BILITOT, PROT, ALBUMIN in the last 168 hours. No results for input(s): LIPASE, AMYLASE in the last 168 hours. No results for input(s): AMMONIA in the last 168 hours. CBC:  Recent Labs Lab 08/24/14 1310 08/24/14 2048 08/25/14 0418  WBC 8.1 10.2 8.9  NEUTROABS 5.6  --  6.3  HGB 7.1* 6.1* 6.1*  HCT 24.6* 21.3* 21.1*  MCV 66.1* 65.7* 67.0*  PLT 273 418* 431*   Cardiac Enzymes: No results for input(s): CKTOTAL, CKMB, CKMBINDEX, TROPONINI in the last 168 hours. BNP (last 3 results) No results for input(s): BNP in the last 8760 hours.  ProBNP (last 3 results) No results for input(s): PROBNP in the last 8760 hours.  CBG: No results for input(s): GLUCAP in the last 168 hours.  Recent Results (from the past 240 hour(s))  Wound culture     Status: None (Preliminary result)   Collection Time: 08/24/14  4:45 PM  Result Value Ref Range Status   Specimen Description WOUND LEFT LEG  Final   Special Requests NONE  Final   Gram Stain   Final    NO WBC SEEN NO SQUAMOUS EPITHELIAL CELLS SEEN NO ORGANISMS SEEN Performed at [facility]    Culture   Final    NO GROWTH 1 DAY Performed at [facility]    Report Status PENDING  Incomplete  Wound culture     Status:  None (Preliminary result)   Collection Time: 08/24/14  5:00 PM  Result Value Ref Range Status   Specimen Description EAR RIGHT  Final   Special Requests Normal  Final   Gram Stain   Final    RARE WBC PRESENT, PREDOMINANTLY MONONUCLEAR NO SQUAMOUS EPITHELIAL CELLS SEEN FEW GRAM POSITIVE RODS FEW GRAM NEGATIVE RODS Performed at [facility]    Culture   Final    Culture reincubated for better growth Performed at [facility]    Report Status PENDING  Incomplete     Studies: No results found.  Scheduled Meds: . enoxaparin (LOVENOX) injection  40 mg Subcutaneous Q24H  . piperacillin-tazobactam (ZOSYN)  IV  3.375 g Intravenous Q8H  . vancomycin  1,500  mg Intravenous Q12H   Continuous Infusions: . sodium chloride 100 mL/hr at 08/24/14 2119    Time spent: 25 minutes  Layia Walla L  [facility] Pager [phone]. If 7PM-7AM, please contact night-coverage at [email or web address], password [email or web address] 08/25/2014, 2:34 PM  LOS: 1 day

## 2014-08-25 NOTE — Progress Notes (Signed)
On-call clinician paged about patient's hypotension BP 92/48. Martin [name] stated that if patient appeared stable, no further interventions were necessary at this time. Will continue to monitor.

## 2014-08-26 LAB — BASIC METABOLIC PANEL
Anion gap: 7 (ref 5–15)
CO2: 24 mmol/L (ref 22–32)
Calcium: 7.8 mg/dL — ABNORMAL LOW (ref 8.9–10.3)
Chloride: 101 mmol/L (ref 101–111)
Creatinine, Ser: 0.93 mg/dL (ref 0.61–1.24)
GFR calc Af Amer: >60 mL/min (ref >60)
GFR calc non Af Amer: >60 mL/min (ref >60)
GLUCOSE: 73 mg/dL (ref 65–99)
Potassium: 3.5 mmol/L (ref 3.5–5.1)
SODIUM: 132 mmol/L — AB (ref 135–145)

## 2014-08-26 LAB — CBC
HEMATOCRIT: 22.2 % — AB (ref 39.0–52.0)
Hemoglobin: 6.4 g/dL — CL (ref 13.0–17.0)
MCH: 19.6 pg — ABNORMAL LOW (ref 26.0–34.0)
MCHC: 28.8 g/dL — ABNORMAL LOW (ref 30.0–36.0)
MCV: 68.1 fL — ABNORMAL LOW (ref 78.0–100.0)
PLATELETS: 406 10*3/uL — AB (ref 150–400)
RBC: 3.26 MIL/uL — ABNORMAL LOW (ref 4.22–5.81)
RDW: 19.3 % — ABNORMAL HIGH (ref 11.5–15.5)
WBC: 10.1 10*3/uL (ref 4.0–10.5)

## 2014-08-26 LAB — WOUND CULTURE
Culture: NO GROWTH
Gram Stain: NONE SEEN
Special Requests: NORMAL

## 2014-08-26 LAB — HEMOGLOBIN AND HEMATOCRIT, BLOOD
HEMATOCRIT: 25.1 % — AB (ref 39.0–52.0)
Hemoglobin: 7.5 g/dL — ABNORMAL LOW (ref 13.0–17.0)

## 2014-08-26 LAB — PREPARE RBC (CROSSMATCH)

## 2014-08-26 MED ORDER — SODIUM CHLORIDE 0.9 % IV SOLN: Freq: Once | INTRAVENOUS | Status: AC

## 2014-08-26 MED ORDER — AMOXICILLIN-POT CLAVULANATE 875-125 MG PO TABS: 1.0000 | ORAL_TABLET | Freq: Two times a day (BID) | ORAL | Status: DC

## 2014-08-26 MED ORDER — ACETAMINOPHEN 325 MG PO TABS: 650.0000 mg | ORAL_TABLET | Freq: Four times a day (QID) | ORAL | Status: DC | PRN

## 2014-08-26 MED ORDER — FERROUS SULFATE 325 (65 FE) MG PO TBEC
325.0000 mg | DELAYED_RELEASE_TABLET | Freq: Every day | ORAL | Status: DC
Start: 2014-08-26 — End: 2019-09-06

## 2014-08-26 NOTE — Progress Notes (Signed)
NURSING PROGRESS NOTE  Martin [name] [id number] Discharge Data: 08/26/2014 5:41 PM Attending Provider: Christiane Haorinna L Sullivan, MD OZD:[id number]:Martin, Sherley BoundsWilliam Cody, PA-C     Martin [name] to be D/C'd Home per MD order.  Discussed with the patient the After Visit Summary and all questions fully answered. All IV's discontinued with no bleeding noted. All belongings returned to patient for patient to take home.   Last Vital Signs:  Blood pressure 96/47, pulse 86, temperature 99.2 F (37.3 C), temperature source Oral, resp. rate 13, height 6\' 1"  (1.854 m), weight 76.703 kg (169 lb 1.6 oz), SpO2 100 %.  Discharge Medication List   Medication List    STOP taking these medications        Ganciclovir 0.15 % Gel  Commonly known as:  ZIRGAN     HYDROcodone-acetaminophen 10-325 MG per tablet  Commonly known as:  NORCO     oxyCODONE-acetaminophen 5-325 MG per tablet  Commonly known as:  PERCOCET      TAKE these medications        acetaminophen 325 MG tablet  Commonly known as:  TYLENOL  Take 2 tablets (650 mg total) by mouth every 6 (six) hours as needed for mild pain (or Fever >/= 101).     amoxicillin-clavulanate 875-125 MG per tablet  Commonly known as:  AUGMENTIN  Take 1 tablet by mouth 2 (two) times daily.     AVEENO ADVANCED CARE EX  Apply 1 application topically daily as needed (for eczema).     doxycycline 100 MG tablet  Commonly known as:  VIBRA-TABS  Take 100 mg by mouth 2 (two) times daily.     ferrous sulfate 325 (65 FE) MG EC tablet  Take 1 tablet (325 mg total) by mouth daily with breakfast.

## 2014-08-26 NOTE — Discharge Summary (Signed)
Physician Discharge Summary  Martin [name] GUY:[id number] DOB: April 12, 1976 DOA: 08/24/2014  PCP: Piedad ClimesMartin, William Cody, PA-C  Admit date: 08/24/2014 Discharge date: 08/26/2014  Time spent: greater than 30 minutes  Recommendations for Outpatient Follow-up:  1. Monitor h/h  Discharge Diagnoses:  Principal Problem:   Sepsis Active Problems:   Hidradenitis suppurativa of trunk, genetalia, thighs - EXTENSIVE   Chronic anemia   Cellulitis of left lower extremity   Anemia of chronic disease   Discharge Condition: stable  Diet recommendation: general  Filed Weights   08/24/14 1235 08/24/14 1854  Weight: 81.647 kg (180 lb) 76.703 kg (169 lb 1.6 oz)    History of present illness:  39 y.o. male with history of severe hidradenitis is [facility] and was recently placed on doxycycline last week in anticipation of possible surgical procedure for his lesions in the groin, started experiencing pain and swelling in his left calf over the last 3-4 days and had started developing discharge today morning. Patient discharges purulent. Patient's lactic acid initially was elevated and patient was tachycardic and was given fluid bolus following which his lactate has improved and patient has been admitted for IV antibiotics. On my exam patient is not in distress and his pain has improved. Denies any nausea or vomiting chest pain shortness of breath or diarrhea. Patient has chronic anemia.   Hospital Course:  Sepsis secondary to leg abscess/cellulitis: fluid resussitated. Empiric vancomycin and zosyn. Blood cx negative to date Active Problems: Draining abscess of left calf with surrounding cellulitis: spontaneously draining, so no I and D needed.  Likely related to hidradenitis suppuritiva. Bid showers.  By discharge, pain, erythema, swelling much improved. Cultures negative. On chronic doxycycline as outpatient. Added augmentin for a few more days  Hidradenitis suppurativa of trunk,  genetalia, thighs - [facility]  Chronic anemia, chronic disease. Also reports occasionally bloody drainage from wounds.  Status post 3 units packed red blood cells. Anemia panel consistent with chronic disease, possible iron deficiency component. Started on iron supplementation as outpt  Procedures:  none  Consultations:  none  Discharge Exam: Filed Vitals:   08/26/14 0548  BP: 90/48  Pulse:   Temp:   Resp:     General: nontoxic. A and o. comfortable Cardiovascular: RRR Respiratory: CTA Ext: no erythema, no induration or tenderness of left calf area. Dried serosanguinous drainage on linens  Discharge Instructions   Discharge Instructions    Activity as tolerated - No restrictions    Complete by:  As directed      Diet general    Complete by:  As directed      Discharge instructions    Complete by:  As directed   Shower/soak draining areas in warm water twice daily          Current Discharge Medication List    START taking these medications   Details  acetaminophen (TYLENOL) 325 MG tablet Take 2 tablets (650 mg total) by mouth every 6 (six) hours as needed for mild pain (or Fever >/= 101).    amoxicillin-clavulanate (AUGMENTIN) 875-125 MG per tablet Take 1 tablet by mouth 2 (two) times daily. Qty: 10 tablet, Refills: 0    ferrous sulfate 325 (65 FE) MG EC tablet Take 1 tablet (325 mg total) by mouth daily with breakfast. Qty: 30 tablet, Refills: 1      CONTINUE these medications which have NOT CHANGED   Details  doxycycline (VIBRA-TABS) 100 MG tablet Take 100  mg by mouth 2 (two) times daily. Refills: 3    Emollient (AVEENO ADVANCED CARE EX) Apply 1 application topically daily as needed (for eczema).      STOP taking these medications     Ganciclovir (ZIRGAN) 0.15 % GEL      HYDROcodone-acetaminophen (NORCO) 10-325 MG per tablet      oxyCODONE-acetaminophen (PERCOCET) 5-325 MG per tablet        No Known  Allergies Follow-up Information    Schedule an appointment as soon as possible for a visit with [facility]    .   Contact information:   [address] [place] [phone]       The results of significant diagnostics from this hospitalization (including imaging, microbiology, ancillary and laboratory) are listed below for reference.    Significant Diagnostic Studies: No results found.  Microbiology: Recent Results (from the past 240 hour(s))  Wound culture     Status: None   Collection Time: 08/24/14  4:45 PM  Result Value Ref Range Status   Specimen Description WOUND LEFT LEG  Final   Special Requests NONE  Final   Gram Stain   Final    NO WBC SEEN NO SQUAMOUS EPITHELIAL CELLS SEEN NO ORGANISMS SEEN Performed at [facility]    Culture   Final    NO GROWTH 2 DAYS Performed at [facility]    Report Status 08/26/2014 FINAL  Final  Wound culture     Status: None   Collection Time: 08/24/14  5:00 PM  Result Value Ref Range Status   Specimen Description EAR RIGHT  Final   Special Requests Normal  Final   Gram Stain   Final    RARE WBC PRESENT, PREDOMINANTLY MONONUCLEAR NO SQUAMOUS EPITHELIAL CELLS SEEN FEW GRAM POSITIVE RODS FEW GRAM NEGATIVE RODS Performed at [facility]    Culture   Final    MULTIPLE ORGANISMS PRESENT, NONE PREDOMINANT Note: NO STAPHYLOCOCCUS AUREUS ISOLATED NO GROUP A STREP (S.PYOGENES) ISOLATED Performed at [facility]    Report Status 08/26/2014 FINAL  Final     Labs: Basic Metabolic Panel:  Recent Labs Lab 08/24/14 1310 08/24/14 2048 08/25/14 0418 08/26/14 0545  NA 133*  --  133* 132*  K 4.1  --  3.7 3.5  CL 101  --  102 101  CO2 24  --  25 24  GLUCOSE 88  --  97 73  BUN 9  --  6 <5*  CREATININE 0.91 0.90 0.89 0.93  CALCIUM 8.2*  --  7.6* 7.8*   Liver Function Tests: No results for input(s): AST, ALT, ALKPHOS, BILITOT, PROT,  ALBUMIN in the last 168 hours. No results for input(s): LIPASE, AMYLASE in the last 168 hours. No results for input(s): AMMONIA in the last 168 hours. CBC:  Recent Labs Lab 08/24/14 1310 08/24/14 2048 08/25/14 0418 08/26/14 0545  WBC 8.1 10.2 8.9 10.1  NEUTROABS 5.6  --  6.3  --   HGB 7.1* 6.1* 6.1* 6.4*  HCT 24.6* 21.3* 21.1* 22.2*  MCV 66.1* 65.7* 67.0* 68.1*  PLT 273 418* 431* 406*   Cardiac Enzymes: No results for input(s): CKTOTAL, CKMB, CKMBINDEX, TROPONINI in the last 168 hours. BNP: BNP (last 3 results) No results for input(s): BNP in the last 8760 hours.  ProBNP (last 3 results) No results for input(s): PROBNP in the last 8760 hours.  CBG: No results for input(s): GLUCAP in the last 168  hours.     Signed:  Manjinder Breau L  [facility] 08/26/2014, 12:51 PM

## 2014-08-27 LAB — TYPE AND SCREEN
ABO/RH(D): B POS
Antibody Screen: NEGATIVE
Unit division: 0
Unit division: 0
Unit division: 0

## 2017-05-24 ENCOUNTER — Encounter: Payer: Self-pay | Admitting: Nurse Practitioner

## 2017-05-24 ENCOUNTER — Ambulatory Visit: Payer: Medicaid Other | Attending: Nurse Practitioner | Admitting: Nurse Practitioner

## 2017-05-24 ENCOUNTER — Encounter (INDEPENDENT_AMBULATORY_CARE_PROVIDER_SITE_OTHER): Payer: Self-pay

## 2017-05-24 VITALS — BP 93/58 | HR 96 | Temp 97.5°F | Ht 73.0 in | Wt 155.8 lb

## 2017-05-24 DIAGNOSIS — Z833 Family history of diabetes mellitus: Secondary | ICD-10-CM | POA: Diagnosis not present

## 2017-05-24 DIAGNOSIS — H1089 Other conjunctivitis: Secondary | ICD-10-CM | POA: Insufficient documentation

## 2017-05-24 DIAGNOSIS — Z836 Family history of other diseases of the respiratory system: Secondary | ICD-10-CM | POA: Diagnosis not present

## 2017-05-24 DIAGNOSIS — R5383 Other fatigue: Secondary | ICD-10-CM

## 2017-05-24 DIAGNOSIS — L732 Hidradenitis suppurativa: Secondary | ICD-10-CM | POA: Insufficient documentation

## 2017-05-24 DIAGNOSIS — Z8619 Personal history of other infectious and parasitic diseases: Secondary | ICD-10-CM | POA: Insufficient documentation

## 2017-05-24 DIAGNOSIS — D649 Anemia, unspecified: Secondary | ICD-10-CM | POA: Diagnosis not present

## 2017-05-24 DIAGNOSIS — Z8249 Family history of ischemic heart disease and other diseases of the circulatory system: Secondary | ICD-10-CM | POA: Insufficient documentation

## 2017-05-24 DIAGNOSIS — R5381 Other malaise: Secondary | ICD-10-CM

## 2017-05-24 DIAGNOSIS — H109 Unspecified conjunctivitis: Secondary | ICD-10-CM

## 2017-05-24 DIAGNOSIS — Z9889 Other specified postprocedural states: Secondary | ICD-10-CM | POA: Diagnosis not present

## 2017-05-24 DIAGNOSIS — L089 Local infection of the skin and subcutaneous tissue, unspecified: Secondary | ICD-10-CM | POA: Insufficient documentation

## 2017-05-24 MED ORDER — POLYMYXIN B-TRIMETHOPRIM 10000-0.1 UNIT/ML-% OP SOLN: 2.0000 [drp] | Freq: Four times a day (QID) | OPHTHALMIC | 0 refills | Status: DC

## 2017-05-24 NOTE — Patient Instructions (Addendum)

## 2017-05-24 NOTE — Progress Notes (Signed)
Assessment & Plan:  Martin [name] was seen today for hospitalization follow-up.  Diagnoses and all orders for this visit:  Anemia, unspecified type -     Ambulatory referral to Hematology -     Ambulatory referral to Gastroenterology  Physical deconditioning -     Ambulatory referral to Physical Therapy  Other fatigue -     VITAMIN D 25 Hydroxy (Vit-D Deficiency, Fractures)   Bacterial conjunctivitis of left eye -     trimethoprim-polymyxin b (POLYTRIM) ophthalmic solution; Place 2 drops into the left eye every 6 (six) hours.  Patient has been counseled on age-appropriate routine health concerns for screening and prevention. These are reviewed and up-to-date. Referrals have been placed accordingly. Immunizations are up-to-date or declined.    Subjective:   Chief Complaint  Patient presents with  . Hospitalization Follow-up    Patient is here for a hospital follow-up on skin infection on his legs and thighs. Patient stated he have uncomfortable pain when he sits down.    Martin [name] 42 y.o. male presents to office today to establish care.    He has a long history of multiple abscesses and wounds to his back, buttocks, lateral thigh, posterior thigh, groin and scrotum requiring surgical intervention due to severe hydradenitis suppurativa. 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 There is also possibility of early Fournier gangrene.  He is currently receiving wound care through the wound clinic in [facility]. His last visit was on 05-18-2017 with next visit scheduled for next Wednesday. He has been referred to dermatology and plastics. He was restarted on bactrim and oral prednisone for polyarthritis recently due to re occurence of skin infection after hospital admission on 04-22-2017. He endorses fatigue and weakness in regards to mobility. Will place PT referral to evaluate for  deconditioning due to multiple hospital admissions   Conjunctivitis   The current episode started 2 days ago. The onset was gradual. The problem occurs continuously. The problem has been gradually improving. The problem is moderate. Nothing relieves the symptoms. The symptoms are aggravated by movement. Associated symptoms include eye itching, eye discharge and eye redness. Pertinent negatives include no fever, no decreased vision, no double vision, no photophobia, no abdominal pain, no constipation, no diarrhea, no nausea, no vomiting, no congestion, no ear discharge, no ear pain, no headaches, no hearing loss, no mouth sores, no cough and no eye pain. The eye pain is mild. The left eye is affected. The eye pain is associated with movement. There were no sick contacts.    Anemia His hemoglobin has been as low as 4 in the past. His stools have also shown occult blood per records reviewed. It does not appear he has been evaluated by GI or Hematology. He denies any chest pain, shortness of breath, palpitations, melena or hematochezia at this time. Will place referrals.  Lab Results  Component Value Date   WBC 10.1 08/26/2014   HGB 7.5 (L) 08/26/2014   HCT 25.1 (L) 08/26/2014   MCV 68.1 (L) 08/26/2014   PLT 406 (H) 08/26/2014     Review of Systems  Constitutional: Positive for malaise/fatigue and weight loss. Negative for chills and fever.  HENT: Negative for congestion, ear discharge, ear pain, hearing loss and mouth sores.   Eyes: Positive for blurred vision, discharge, redness and itching. Negative for double vision, photophobia and pain.  Respiratory: Negative.  Negative for cough  and shortness of breath.   Cardiovascular: Negative.  Negative for chest pain, palpitations and leg swelling.  Gastrointestinal: Negative.  Negative for abdominal pain, constipation, diarrhea, heartburn, nausea and vomiting.  Musculoskeletal: Positive for back pain and myalgias.  Skin:       SEE HPI    Neurological: Positive for weakness. Negative for dizziness, focal weakness, seizures and headaches.  Endo/Heme/Allergies: Negative for environmental allergies.  Psychiatric/Behavioral: Positive for depression. Negative for suicidal ideas.    Past Medical History:  Diagnosis Date  . Abscess of buttock   . Abscess of thigh s/p I&D in past 10/26/2013  . Anemia   . Blood transfusion without reported diagnosis   . Chicken pox   . Hydradenitis     Past Surgical History:  Procedure Laterality Date  . ABCESS DRAINAGE    . Unremarkable.      Family History  Problem Relation Age of Onset  . Healthy Mother        Living  . Diabetes Maternal Grandmother   . Hypertension Maternal Grandfather   . Emphysema Maternal Uncle        x2-Deceased  . Other Sister        Hydradenitis    Social History Reviewed with no changes to be made today.   Outpatient Medications Prior to Visit  Medication Sig Dispense Refill  . acetaminophen (TYLENOL) 325 MG tablet Take 2 tablets (650 mg total) by mouth every 6 (six) hours as needed for mild pain (or Fever >/= 101). (Patient not taking: Reported on 05/24/2017)    . amoxicillin-clavulanate (AUGMENTIN) 875-125 MG per tablet Take 1 tablet by mouth 2 (two) times daily. (Patient not taking: Reported on 05/24/2017) 10 tablet 0  . doxycycline (VIBRA-TABS) 100 MG tablet Take 100 mg by mouth 2 (two) times daily.  3  . Emollient (AVEENO ADVANCED CARE EX) Apply 1 application topically daily as needed (for eczema).    . ferrous sulfate 325 (65 FE) MG EC tablet Take 1 tablet (325 mg total) by mouth daily with breakfast. (Patient not taking: Reported on 05/24/2017) 30 tablet 1   No facility-administered medications prior to visit.     No Known Allergies     Objective:    BP (!) 93/58 (BP Location: Left Arm, Patient Position: Sitting, Cuff Size: Normal)   Pulse 96   Temp (!) 97.5 F (36.4 C) (Oral)   Ht 6\' 1"  (1.854 m)   Wt 155 lb 12.8 oz (70.7 kg)   SpO2  100%   BMI 20.56 kg/m  Wt Readings from Last 3 Encounters:  05/24/17 155 lb 12.8 oz (70.7 kg)  08/24/14 169 lb 1.6 oz (76.7 kg)  04/06/14 175 lb (79.4 kg)    Physical Exam  Constitutional: He is oriented to person, place, and time. He appears well-developed and well-nourished. He is cooperative.  HENT:  Head: Normocephalic and atraumatic.  Eyes: EOM are normal. Pupils are equal, round, and reactive to light. Right eye exhibits no chemosis, no discharge and no hordeolum. No foreign body present in the right eye. Left eye exhibits chemosis and discharge. Left eye exhibits no hordeolum. No foreign body present in the left eye. Right conjunctiva is not injected. Left conjunctiva is injected.  Neck: Normal range of motion.  Cardiovascular: Normal rate, regular rhythm, normal heart sounds and intact distal pulses. Exam reveals no gallop and no friction rub.  No murmur heard. Pulmonary/Chest: Effort normal and breath sounds normal. No tachypnea. No respiratory distress. He has no decreased breath  sounds. He has no wheezes. He has no rhonchi. He has no rales. He exhibits no tenderness.  Abdominal: Soft. Bowel sounds are normal.  Musculoskeletal: Normal range of motion. He exhibits no edema.  Neurological: He is alert and oriented to person, place, and time. Coordination normal.  Skin: Skin is warm and dry.  Wounds were not assessed today as they were recently dressed and patient has appointment with wound care center on 06-02-2017.  Psychiatric: He has a normal mood and affect. His behavior is normal. Judgment and thought content normal.  Nursing note and vitals reviewed.     Patient has been counseled extensively about nutrition and exercise as well as the importance of adherence with medications and regular follow-up. The patient was given clear instructions to go to ER or return to medical center if symptoms don't improve, worsen or new problems develop. The patient verbalized understanding.    Follow-up: Return if symptoms worsen or fail to improve.   Claiborne RiggZelda W Maliyah Willets, FNP-BC [facility] [place], [place] [phone]   05/24/2017, 10:42 PM

## 2017-06-03 ENCOUNTER — Ambulatory Visit: Payer: Medicaid Other | Admitting: Physical Therapy

## 2017-06-10 ENCOUNTER — Ambulatory Visit: Payer: Medicaid Other | Attending: Nurse Practitioner | Admitting: Physical Therapy

## 2017-07-16 ENCOUNTER — Other Ambulatory Visit: Payer: Self-pay | Admitting: Family

## 2017-07-16 DIAGNOSIS — D649 Anemia, unspecified: Secondary | ICD-10-CM

## 2017-07-19 ENCOUNTER — Ambulatory Visit: Payer: Medicaid Other | Admitting: Family

## 2017-07-19 ENCOUNTER — Other Ambulatory Visit: Payer: Medicaid Other

## 2019-09-06 ENCOUNTER — Other Ambulatory Visit: Payer: Self-pay

## 2019-09-06 ENCOUNTER — Encounter: Payer: Self-pay | Admitting: Nurse Practitioner

## 2019-09-06 ENCOUNTER — Ambulatory Visit: Payer: Medicaid Other | Attending: Nurse Practitioner | Admitting: Nurse Practitioner

## 2019-09-06 VITALS — BP 119/72 | HR 91 | Temp 97.7°F | Ht 72.44 in | Wt 226.0 lb

## 2019-09-06 DIAGNOSIS — Z8249 Family history of ischemic heart disease and other diseases of the circulatory system: Secondary | ICD-10-CM | POA: Diagnosis not present

## 2019-09-06 DIAGNOSIS — Z13220 Encounter for screening for lipoid disorders: Secondary | ICD-10-CM

## 2019-09-06 DIAGNOSIS — Z833 Family history of diabetes mellitus: Secondary | ICD-10-CM | POA: Diagnosis not present

## 2019-09-06 DIAGNOSIS — L732 Hidradenitis suppurativa: Secondary | ICD-10-CM

## 2019-09-06 DIAGNOSIS — D649 Anemia, unspecified: Secondary | ICD-10-CM | POA: Insufficient documentation

## 2019-09-06 DIAGNOSIS — E559 Vitamin D deficiency, unspecified: Secondary | ICD-10-CM | POA: Diagnosis not present

## 2019-09-06 DIAGNOSIS — Z131 Encounter for screening for diabetes mellitus: Secondary | ICD-10-CM | POA: Diagnosis not present

## 2019-09-06 MED ORDER — FERROUS SULFATE 325 (65 FE) MG PO TBEC: 325.0000 mg | DELAYED_RELEASE_TABLET | Freq: Every day | ORAL | 1 refills | Status: AC

## 2019-09-06 MED ORDER — DOXYCYCLINE HYCLATE 100 MG PO TABS: 100.0000 mg | ORAL_TABLET | Freq: Two times a day (BID) | ORAL | 0 refills | Status: AC

## 2019-09-06 MED ORDER — FERROUS SULFATE 325 (65 FE) MG PO TBEC: 325.0000 mg | DELAYED_RELEASE_TABLET | Freq: Every day | ORAL | 1 refills | Status: DC

## 2019-09-06 NOTE — Progress Notes (Signed)
hemira

## 2019-09-06 NOTE — Progress Notes (Signed)
Assessment & Plan:  Martin [name] was seen today for establish care.  Diagnoses and all orders for this visit:  Hidradenitis suppurativa of trunk, genetalia, thighs - EXTENSIVE -     Ambulatory referral to Dermatology -     doxycycline (VIBRA-TABS) 100 MG tablet; Take 1 tablet (100 mg total) by mouth 2 (two) times daily. -     AMB referral to wound care center -     CBC  Chronic anemia -     CBC -     CMP14+EGFR -     ferrous sulfate 325 (65 FE) MG EC tablet; Take 1 tablet (325 mg total) by mouth daily with breakfast.  Vitamin D deficiency -     VITAMIN D 25 Hydroxy (Vit-D Deficiency, Fractures)  Encounter for screening for diabetes mellitus -     Hemoglobin A1c  Lipid screening -     Lipid panel    Patient has been counseled on age-appropriate routine health concerns for screening and prevention. These are reviewed and up-to-date. Referrals have been placed accordingly. Immunizations are up-to-date or declined.    Subjective:   Chief Complaint  Patient presents with  . Establish Care    Pt. is here for re-establish care.    HPI Martin [name] 44 y.o. male presents to office today to re establish care.   has a past medical history of Abscess of buttock, Abscess of thigh s/p I&D in past (10/26/2013), Anemia, Blood transfusion without reported diagnosis, Chicken pox, and Hydradenitis.  I have not seen him here in this office since 2019 when he established care with me. Unfortunately he has been incarcerated and was recently released.  He has a very extensive history of complicate hidradenitis suppurativa with multiple abcseses and wound to his back buttocks, lateral thigh, posterior thigh, groin and scrotum. He was receiving wound care through the wound clinic in [facility] and also was being followed by Dermatology. He is requesting referral to a Hidradenitis Specialist in [place] today and will also need to be referred back to the wound clinic.He states he was also started on  humira and was taking this while incarcerated.  He was receiving wound care and abx as well there.  Will start him back on Doxy and have him follow up with specialist.   He is currently dressing his wounds and has antibacterial cleanser (VASHE) that was given to him after he was released from the prison. This is a chronic condition he has been dealing with for over 20 years. He has been on clinda, augmentin, bactrim, vanc, zosyn, rifampin, and doxy in the past as well as steroids. His sister also has HS.        Review of Systems  Constitutional: Negative for fever, malaise/fatigue and weight loss.  HENT: Negative.  Negative for nosebleeds.   Eyes: Negative.  Negative for blurred vision, double vision and photophobia.  Respiratory: Negative.  Negative for cough and shortness of breath.   Cardiovascular: Negative.  Negative for chest pain, palpitations and leg swelling.  Gastrointestinal: Negative.  Negative for heartburn, nausea and vomiting.  Musculoskeletal: Negative.  Negative for myalgias.  Skin:       SEE HPI  Neurological: Negative.  Negative for dizziness, focal weakness, seizures and headaches.  Psychiatric/Behavioral: Negative.  Negative for suicidal ideas.  Past Medical History:  Diagnosis Date  . Abscess of buttock   . Abscess of thigh s/p I&D in past 10/26/2013  . Anemia   . Blood transfusion without reported diagnosis   . Chicken pox   . Hydradenitis     Past Surgical History:  Procedure Laterality Date  . ABCESS DRAINAGE    . Unremarkable.      Family History  Problem Relation Age of Onset  . Healthy Mother        Living  . Diabetes Maternal Grandmother   . Hypertension Maternal Grandfather   . Emphysema Maternal Uncle        x2-Deceased  . Other Sister        Hydradenitis    Social History Reviewed with no changes to be made today.   Outpatient Medications Prior to Visit  Medication Sig Dispense  Refill  . Adalimumab (HUMIRA PEN [place]) Inject into the skin.    . Emollient (AVEENO ADVANCED CARE EX) Apply 1 application topically daily as needed (for eczema).    Marland Kitchen acetaminophen (TYLENOL) 325 MG tablet Take 2 tablets (650 mg total) by mouth every 6 (six) hours as needed for mild pain (or Fever >/= 101). (Patient not taking: Reported on 05/24/2017)    . amoxicillin-clavulanate (AUGMENTIN) 875-125 MG per tablet Take 1 tablet by mouth 2 (two) times daily. (Patient not taking: Reported on 05/24/2017) 10 tablet 0  . doxycycline (VIBRA-TABS) 100 MG tablet Take 100 mg by mouth 2 (two) times daily.  3  . ferrous sulfate 325 (65 FE) MG EC tablet Take 1 tablet (325 mg total) by mouth daily with breakfast. (Patient not taking: Reported on 05/24/2017) 30 tablet 1  . trimethoprim-polymyxin b (POLYTRIM) ophthalmic solution Place 2 drops into the left eye every 6 (six) hours. (Patient not taking: Reported on 09/06/2019) 10 mL 0   No facility-administered medications prior to visit.    No Known Allergies     Objective:    BP 119/72 (BP Location: Left Arm, Patient Position: Sitting, Cuff Size: Normal)   Pulse 91   Temp 97.7 F (36.5 C) (Temporal)   Ht 6' 0.44" (1.84 m)   Wt 226 lb (102.5 kg)   SpO2 98%   BMI 30.28 kg/m  Wt Readings from Last 3 Encounters:  09/06/19 226 lb (102.5 kg)  05/24/17 155 lb 12.8 oz (70.7 kg)  08/24/14 169 lb 1.6 oz (76.7 kg)    Physical Exam Vitals and nursing note reviewed.  Constitutional:      Appearance: He is well-developed.  HENT:     Head: Normocephalic and atraumatic.  Cardiovascular:     Rate and Rhythm: Normal rate and regular rhythm.     Heart sounds: Normal heart sounds. No murmur. No friction rub. No gallop.   Pulmonary:     Effort: Pulmonary effort is normal. No tachypnea or respiratory distress.     Breath sounds: Normal breath sounds. No decreased breath sounds, wheezing, rhonchi or rales.  Chest:     Chest wall: No tenderness.  Abdominal:      General: Bowel sounds are normal.     Palpations: Abdomen is soft.  Musculoskeletal:        General: Normal range of motion.     Cervical back: Normal range of motion.  Skin:    General: Skin is warm and dry.     Comments: Numerous wounds on buttocks L>R. There is very little drainage from the wounds. The current drainage is serosanguinous. There is  a mildly unpleasant odor noted when bandages were removed. There are areas of open skin with pink healthy viable tissue observed. I was unable to attach pictures to the document even though they were taken during this visit.   Neurological:     Mental Status: He is alert and oriented to person, place, and time.     Coordination: Coordination normal.  Psychiatric:        Behavior: Behavior normal. Behavior is cooperative.        Thought Content: Thought content normal.        Judgment: Judgment normal.            Patient has been counseled extensively about nutrition and exercise as well as the importance of adherence with medications and regular follow-up. The patient was given clear instructions to go to ER or return to medical center if symptoms don't improve, worsen or new problems develop. The patient verbalized understanding.   Follow-up: Return if symptoms worsen or fail to improve.   Gildardo Pounds, FNP-BC [facility] [place], [place]   09/07/2019, 9:26 PM

## 2019-09-07 ENCOUNTER — Encounter: Payer: Self-pay | Admitting: Nurse Practitioner

## 2019-09-07 LAB — CMP14+EGFR
ALT: 16 IU/L (ref 0–44)
AST: 21 IU/L (ref 0–40)
Albumin/Globulin Ratio: 0.8 — ABNORMAL LOW (ref 1.2–2.2)
Albumin: 3.8 g/dL — ABNORMAL LOW (ref 4.0–5.0)
Alkaline Phosphatase: 198 IU/L — ABNORMAL HIGH (ref 48–121)
BUN/Creatinine Ratio: 15 (ref 9–20)
BUN: 15 mg/dL (ref 6–24)
Bilirubin Total: 0.3 mg/dL (ref 0.0–1.2)
CO2: 24 mmol/L (ref 20–29)
Calcium: 9.1 mg/dL (ref 8.7–10.2)
Chloride: 105 mmol/L (ref 96–106)
Creatinine, Ser: 1.02 mg/dL (ref 0.76–1.27)
GFR calc Af Amer: 105 mL/min/{1.73_m2} (ref >59)
GFR calc non Af Amer: 91 mL/min/{1.73_m2} (ref >59)
Globulin, Total: 5 g/dL — ABNORMAL HIGH (ref 1.5–4.5)
Glucose: 88 mg/dL (ref 65–99)
Potassium: 4.4 mmol/L (ref 3.5–5.2)
Sodium: 141 mmol/L (ref 134–144)
Total Protein: 8.8 g/dL — ABNORMAL HIGH (ref 6.0–8.5)

## 2019-09-07 LAB — VITAMIN D 25 HYDROXY (VIT D DEFICIENCY, FRACTURES): Vit D, 25-Hydroxy: 23.4 ng/mL — ABNORMAL LOW (ref 30.0–100.0)

## 2019-09-07 LAB — LIPID PANEL
Chol/HDL Ratio: 3 ratio (ref 0.0–5.0)
Cholesterol, Total: 140 mg/dL (ref 100–199)
HDL: 46 mg/dL (ref >39)
LDL Chol Calc (NIH): 83 mg/dL (ref 0–99)
Triglycerides: 48 mg/dL (ref 0–149)
VLDL Cholesterol Cal: 11 mg/dL (ref 5–40)

## 2019-09-07 LAB — CBC
Hematocrit: 40 % (ref 37.5–51.0)
Hemoglobin: 12.7 g/dL — ABNORMAL LOW (ref 13.0–17.7)
MCH: 25.1 pg — ABNORMAL LOW (ref 26.6–33.0)
MCHC: 31.8 g/dL (ref 31.5–35.7)
MCV: 79 fL (ref 79–97)
Platelets: 142 10*3/uL — ABNORMAL LOW (ref 150–450)
RBC: 5.06 x10E6/uL (ref 4.14–5.80)
RDW: 14.9 % (ref 11.6–15.4)
WBC: 6.2 10*3/uL (ref 3.4–10.8)

## 2019-09-07 LAB — HEMOGLOBIN A1C
Est. average glucose Bld gHb Est-mCnc: 105 mg/dL
Hgb A1c MFr Bld: 5.3 % (ref 4.8–5.6)

## 2019-09-12 ENCOUNTER — Encounter (HOSPITAL_BASED_OUTPATIENT_CLINIC_OR_DEPARTMENT_OTHER): Payer: Medicaid Other | Attending: Internal Medicine | Admitting: Internal Medicine

## 2019-09-12 ENCOUNTER — Other Ambulatory Visit: Payer: Self-pay

## 2019-09-12 DIAGNOSIS — L732 Hidradenitis suppurativa: Secondary | ICD-10-CM | POA: Diagnosis not present

## 2019-09-12 DIAGNOSIS — D509 Iron deficiency anemia, unspecified: Secondary | ICD-10-CM | POA: Diagnosis not present

## 2019-09-12 DIAGNOSIS — L98498 Non-pressure chronic ulcer of skin of other sites with other specified severity: Secondary | ICD-10-CM | POA: Insufficient documentation

## 2019-09-12 DIAGNOSIS — L02411 Cutaneous abscess of right axilla: Secondary | ICD-10-CM | POA: Diagnosis not present

## 2019-09-14 NOTE — Progress Notes (Signed)
Martin [name] ([id number]) . Visit Report for 09/12/2019 Abuse/Suicide Risk Screen Details Patient Name: Date of Service: Martin [name], Martin [name]. 09/12/2019 1:15 PM Medical Record Number: [id number] Patient Account Number: [id number] Date of Birth/Sex: Treating RN: [birth year] (44 y.o. Male) Martin [name] Primary Care Martin [name]: Martin [name] Other Clinician: Referring Martin [name]: Treating Martin [name]/Extender: Martin [name], Martin [name] Weeks in Treatment: 0 Abuse/Suicide Risk Screen Items Answer ABUSE RISK SCREEN: Has anyone close to you tried to hurt or harm you recentlyo No Do you feel uncomfortable with anyone in your familyo No Has anyone forced you do things that you didnt want to doo No Electronic Signature(s) Signed: 09/13/2019 4:36:43 PM By: Martin Coria RN Signed: 09/14/2019 9:12:52 AM By: Martin [name] Entered By: Martin [name] on 09/12/2019 13:57:21 -------------------------------------------------------------------------------- Activities of Daily Living Details Patient Name: Date of Service: Martin [name], Martin [name]. 09/12/2019 1:15 PM Medical Record Number: [id number] Patient Account Number: [id number] Date of Birth/Sex: Treating RN: Martin [name] (44 y.o. Male) Martin [name] Primary Care Martin [name]: Martin [name] Other Clinician: Referring Martin [name]: Treating Freedom [name]/Extender: Martin [name], Martin [name] in Treatment: 0 Activities of Daily Living Items Answer Activities of Daily Living (Please select one for each item) Drive Automobile Completely Able [name] Medications ake Completely Able [name] Completely Able Care for Appearance Completely Able [name] Completely Able Bath / Shower Completely Able Dress Self Completely Able Feed Self Completely Able Walk Completely Able Get In / Out Bed Completely Able Housework Completely Able Prepare Meals Completely Beatrice for Self Completely Able Electronic  Signature(s) Signed: 09/13/2019 4:36:43 PM By: Martin Coria RN Signed: 09/14/2019 9:12:52 AM By: Martin [name] Entered By: Martin [name] on 09/12/2019 13:57:40 -------------------------------------------------------------------------------- Education Screening Details Patient Name: Date of Service: Martin [name], Martin [name]. 09/12/2019 1:15 PM Medical Record Number: [id number] Patient Account Number: [id number] Date of Birth/Sex: Treating RN: [birth year] (43 y.o. Male) Martin [name] Primary Care Martin [name]: Martin [name] Other Clinician: Referring Martin [name]: Treating Martin [name]/Extender: Martin [name] in Treatment: 0 Primary Learner Assessed: Patient Learning Preferences/Education Level/Primary Language Learning Preference: Explanation, Demonstration, Printed Material Highest Education Level: High School Preferred Language: English Cognitive Barrier Language Barrier: No Translator Needed: No Memory Deficit: No Emotional Barrier: No Cultural/Religious Beliefs Affecting Medical Care: No Physical Barrier Impaired Vision: No Impaired Hearing: No Decreased Hand dexterity: No Knowledge/Comprehension Knowledge Level: High Comprehension Level: High Ability to understand written instructions: High Ability to understand verbal instructions: High Motivation Anxiety Level: Calm Cooperation: Cooperative Education Importance: Acknowledges Need Interest in Health Problems: Asks Questions Perception: Coherent Willingness to Engage in Self-Management High Activities: Readiness to Engage in Self-Management High Activities: Electronic Signature(s) Signed: 09/13/2019 4:36:43 PM By: Martin Coria RN Signed: 09/14/2019 9:12:52 AM By: Martin [name] Entered By: Martin [name] on 09/12/2019 13:58:00 -------------------------------------------------------------------------------- Fall Risk Assessment Details Patient Name: Date of Service: Martin [name], Martin [name]. 09/12/2019 1:15  PM Medical Record Number: [id number] Patient Account Number: [id number] Date of Birth/Sex: Treating RN: Martin [birth year], [name] (43 y.o. Male) Martin [name] Primary Care Martin [name]: Martin [name] Other Clinician: Referring Martin [name]: Treating Kelbie Moro/Extender: Martin [name], Martin [name] in Treatment: 0 Fall Risk Assessment Items Have you had 2 or more falls in the last 12 monthso 0 No Have you had any fall that resulted in injury in the last 12 monthso 0 No FALLS RISK SCREEN History of falling - immediate or within 3 months 0 No Secondary diagnosis (Do you have 2 or more medical diagnoseso) 0 No Ambulatory aid  None/bed rest/wheelchair/nurse 0 Yes Crutches/cane/walker 0 No Furniture 0 No Intravenous therapy Access/Saline/Heparin Lock 0 No Gait/Transferring Normal/ bed rest/ wheelchair 0 Yes Weak (short steps with or without shuffle, stooped but able to lift head while walking, may seek 0 No support from furniture) Impaired (short steps with shuffle, may have difficulty arising from chair, head down, impaired 0 No balance) Mental Status Oriented to own ability 0 Yes Electronic Signature(s) Signed: 09/13/2019 4:36:43 PM By: Martin Pax RN Signed: 09/14/2019 9:12:52 AM By: Martin [name] Entered By: Martin [name] on 09/12/2019 13:58:37 -------------------------------------------------------------------------------- Nutrition Risk Screening Details Patient Name: Date of Service: Martin [name], Martin [name]. 09/12/2019 1:15 PM Medical Record Number: [id number] Patient Account Number: [id number] Date of Birth/Sex: Treating RN: [name] (44 y.o. Male) Martin [name] Primary Care Martin [name]: Bertram [place] Other Clinician: Referring Shahla Betsill: Treating Kathi Dohn/Extender: Martin [name], Martin [name] Weeks in Treatment: 0 Height (in): 73 Weight (lbs): 220 Body Mass Index (BMI): 29 Nutrition Risk Screening Items Score Screening NUTRITION RISK SCREEN: I have an illness or condition that  made me change the kind and/or amount of food I eat 0 No I eat fewer than two meals per day 0 No I eat few fruits and vegetables, or milk products 0 No I have three or more drinks of beer, liquor or wine almost every day 0 No I have tooth or mouth problems that make it hard for me to eat 0 No I don'[name] always have enough money to buy the food I need 0 No I eat alone most of the time 0 No I take three or more different prescribed or over-the-counter drugs a day 1 Yes Without wanting to, I have lost or gained 10 pounds in the last six months 0 No I am not always physically able to shop, cook and/or feed myself 0 No Nutrition Protocols Good Risk Protocol 0 No interventions needed Moderate Risk Protocol High Risk Proctocol Risk Level: Good Risk Score: 1 Electronic Signature(s) Signed: 09/13/2019 4:36:43 PM By: Martin Pax RN Signed: 09/14/2019 9:12:52 AM By: Martin [name] Entered By: Martin [name] on 09/12/2019 13:59:21

## 2019-09-14 NOTE — Progress Notes (Addendum)
[name] ([id number]) . Visit Report for 09/12/2019 Allergy List Details Patient Name: Date of Service: Martin [name], Martin [name]. 09/12/2019 1:15 PM Medical Record Number: [id number] Patient Account Number: [id number] Date of Birth/Sex: Treating RN: Dec 10, 1975 (44 y.o. Male) Yevonne Pax Primary Care Dicie Edelen: Bertram Denver Other Clinician: Referring Maleeka Sabatino: Treating Emmalee Solivan/Extender: Davene Costain, Shon Hale Weeks in Treatment: 0 Allergies Active Allergies No Known Drug Allergies Allergy Notes Electronic Signature(s) Signed: 09/14/2019 9:12:52 AM By: Karl Ito Entered By: Karl Ito on 09/12/2019 13:52:35 -------------------------------------------------------------------------------- Arrival Information Details Patient Name: Date of Service: Martin [name], Martin [name]. 09/12/2019 1:15 PM Medical Record Number: [id number] Patient Account Number: [id number] Date of Birth/Sex: Treating RN: [birth year] (43 y.o. Male) Yevonne Pax Primary Care Decklin Weddington: Bertram Denver Other Clinician: Referring Christe Tellez: Treating Khiley Lieser/Extender: Neale Burly in Treatment: 0 Visit Information Patient Arrived: Ambulatory Arrival Time: 13:42 Accompanied By: self Transfer Assistance: None Patient Identification Verified: Yes Secondary Verification Process Completed: Yes Electronic Signature(s) Signed: 09/14/2019 9:12:52 AM By: Karl Ito Entered By: Karl Ito on 09/12/2019 13:42:54 -------------------------------------------------------------------------------- Clinic Level of Care Assessment Details Patient Name: Date of Service: Martin [name], Martin [name]. 09/12/2019 1:15 PM Medical Record Number: [id number] Patient Account Number: [id number] Date of Birth/Sex: Treating RN: [birth year] (43 y.o. Male) Yevonne Pax Primary Care Tierra Divelbiss: Bertram Denver Other Clinician: Referring Kenzli Barritt: Treating Child Campoy/Extender: Neale Burly in Treatment: 0 Clinic Level of Care Assessment Items TOOL 2 Quantity Score X- 1 0 Use when only an EandM is performed on the INITIAL visit ASSESSMENTS - Nursing Assessment / Reassessment X- 1 20 General Physical Exam (combine w/ comprehensive assessment (listed just below) when performed on new pt. evals) X- 1 25 Comprehensive Assessment (HX, ROS, Risk Assessments, Wounds Hx, etc.) ASSESSMENTS - Wound and Skin A ssessment / Reassessment X - Simple Wound Assessment / Reassessment - one wound 1 5 []  - 0 Complex Wound Assessment / Reassessment - multiple wounds []  - 0 Dermatologic / Skin Assessment (not related to wound area) ASSESSMENTS - Ostomy and/or Continence Assessment and Care []  - 0 Incontinence Assessment and Management []  - 0 Ostomy Care Assessment and Management (repouching, etc.) PROCESS - Coordination of Care X - Simple Patient / Family Education for ongoing care 1 15 []  - 0 Complex (extensive) Patient / Family Education for ongoing care X- 1 10 Staff obtains , Records, [name] Results / Process Orders est []  - 0 Staff telephones HHA, Nursing Homes / Clarify orders / etc []  - 0 Routine Transfer to another Facility (non-emergent condition) []  - 0 Routine Hospital Admission (non-emergent condition) X- 1 15 New Admissions / / Ordering NPWT Apligraf, etc. , []  - 0 Emergency Hospital Admission (emergent condition) X- 1 10 Simple Discharge Coordination []  - 0 Complex (extensive) Discharge Coordination PROCESS - Special Needs []  - 0 Pediatric / Minor Patient Management []  - 0 Isolation Patient Management []  - 0 Hearing / Language / Visual special needs []  - 0 Assessment of Community assistance (transportation, D/C planning, etc.) []  - 0 Additional assistance / Altered mentation []  - 0 Support Surface(s) Assessment (bed, cushion, seat, etc.) INTERVENTIONS - Wound Cleansing / Measurement X- 1 5 Wound Imaging  (photographs - any number of wounds) []  - 0 Wound Tracing (instead of photographs) X- 1 5 Simple Wound Measurement - one wound []  - 0 Complex Wound Measurement - multiple wounds X- 1 5 Simple Wound Cleansing - one wound []  - 0 Complex Wound Cleansing - multiple  wounds INTERVENTIONS - Wound Dressings []  - 0 Small Wound Dressing one or multiple wounds []  - 0 Medium Wound Dressing one or multiple wounds X- 3 20 Large Wound Dressing one or multiple wounds []  - 0 Application of Medications - injection INTERVENTIONS - Miscellaneous []  - 0 External ear exam []  - 0 Specimen Collection (cultures, biopsies, blood, body fluids, etc.) []  - 0 Specimen(s) / Culture(s) sent or taken to Lab for analysis []  - 0 Patient Transfer (multiple staff / Lift / Similar devices) []  - 0 Simple Staple / Suture removal (25 or less) []  - 0 Complex Staple / Suture removal (26 or more) []  - 0 Hypo / Hyperglycemic Management (close monitor of Blood Glucose) X- 1 15 Ankle / Brachial Index (ABI) - do not check if billed separately Has the patient been seen at the hospital within the last three years: Yes Total Score: 190 Level Of Care: New/Established - Level 5 Electronic Signature(s) Signed: 09/13/2019 4:36:43 PM By: RN Entered By: on 09/12/2019 14:56:36 -------------------------------------------------------------------------------- Encounter Discharge Information Details Patient Name: Date of Service: Martin [name], Martin [name]. 09/12/2019 1:15 PM Medical Record Number: Patient Account Number: Michiel Sites Date of Birth/Sex: Treating RN: March 17, 1976 (44 y.o. Male) Primary Care Marzelle Rutten: 11/13/2019 Other Clinician: Referring Jessiah Steinhart: Treating Vadie Principato/Extender: Yevonne Pax in Treatment: 0 Encounter Discharge Information Items Discharge Condition: Stable Ambulatory Status: Ambulatory Discharge Destination:  Home Transportation: Private Auto Accompanied By: self Schedule Follow-up Appointment: Yes Clinical Summary of Care: Patient Declined Electronic Signature(s) Signed: 09/13/2019 7:13:26 AM By: 11/12/2019 Entered By: 11/12/2019 on 09/12/2019 15:16:56 -------------------------------------------------------------------------------- Multi Wound Chart Details Patient Name: Date of Service: Martin [name], Martin [name]. 09/12/2019 1:15 PM Medical Record Number: 11/21/1975 Patient Account Number: [id number] Date of Birth/Sex: Treating RN: September 25, 1975 (43 y.o. Male) Neale Burly Primary Care Cashis Rill: 11/13/2019 Other Clinician: Referring Oasis Goehring: Treating Orvie Caradine/Extender: Cherylin Mylar, Cherylin Mylar Weeks in Treatment: 0 Vital Signs Height(in): 73 Pulse(bpm): 89 Weight(lbs): 220 Blood Pressure(mmHg): 112/67 Body Mass Index(BMI): 29 Temperature(F): 99.1 Respiratory Rate(breaths/min): 18 Photos: [1:No Photos Right Axilla] [2:No Photos Left, Proximal Gluteus] [3:No Photos Left Gluteus] Wound Location: [1:Gradually Appeared] [2:Gradually Appeared] [3:Gradually Appeared] Wounding Event: [1:Hidradenitis] [2:Hidradenitis] [3:Hidradenitis] Primary Etiology: [1:Anemia] [2:Anemia] [3:Anemia] Comorbid History: [1:04/13/2018] [2:04/13/2018] [3:04/13/2018] Date Acquired: [1:0] [2:0] [3:0] Weeks of Treatment: [1:Open] [2:Open] [3:Open] Wound Status: [1:1.5x4.7x0.1] [2:3.2x3x0.1] [3:0.8x0.5x0.5] Measurements L x W x D (cm) [1:5.537] [2:7.54] [3:0.314] A (cm) : rea [1:0.554] [2:0.754] [3:0.157] Volume (cm) : [1:0.00%] [2:0.00%] [3:N/A] % Reduction in A rea: [1:0.00%] [2:0.00%] [3:N/A] % Reduction in Volume: [1:Full Thickness Without Exposed] [2:Full Thickness Without Exposed] [3:Full Thickness Without Exposed] Classification: [1:Support Structures Medium] [2:Support Structures Medium] [3:Support Structures Medium] Exudate Amount: [1:Serosanguineous] [2:Serosanguineous]  [3:Serosanguineous] Exudate Type: [1:red, brown] [2:red, brown] [3:red, brown] Exudate Color: [1:Well defined, not attached] [2:Well defined, not attached] [3:Well defined, not attached] Wound Margin: [1:Large (67-100%)] [2:Large (67-100%)] [3:Large (67-100%)] Granulation Amount: [1:Red] [2:Red] [3:Red] Granulation Quality: [1:None Present (0%)] [2:None Present (0%)] [3:None Present (0%)] Necrotic Amount: [1:Fat Layer (Subcutaneous Tissue)] [2:Fat Layer (Subcutaneous Tissue)] [3:Fat Layer (Subcutaneous Tissue)] Exposed Structures: [1:Exposed: Yes Fascia: No Tendon: No Muscle: No Joint: No Bone: No None] [2:Exposed: Yes Fascia: No Tendon: No Muscle: No Joint: No Bone: No None] [3:Exposed: Yes Fascia: No Tendon: No Muscle: No Joint: No Bone: No None] Wound Number: 4 5 N/A Photos: No Photos No Photos N/A Left, Medial Gluteus Right, Lateral Gluteus N/A Wound Location: Gradually Appeared Gradually Appeared  N/A Wounding Event: Hidradenitis Hidradenitis N/A Primary Etiology: Anemia Anemia N/A Comorbid History: 04/13/2018 04/13/2018 N/A Date Acquired: 0 0 N/A Weeks of Treatment: Open Open N/A Wound Status: 1x0.2x0.9 0.1x0.8x0.8 N/A Measurements L x W x D (cm) 0.157 0.063 N/A A (cm) : rea 0.141 0.05 N/A Volume (cm) : N/A N/A N/A % Reduction in A rea: N/A N/A N/A % Reduction in Volume: Full Thickness Without Exposed Full Thickness Without Exposed N/A Classification: Support Structures Support Structures Medium Medium N/A Exudate Amount: Serosanguineous Serosanguineous N/A Exudate Type: red, brown red, brown N/A Exudate Color: Well defined, not attached Well defined, not attached N/A Wound Margin: Large (67-100%) Large (67-100%) N/A Granulation Amount: Red Red N/A Granulation Quality: None Present (0%) None Present (0%) N/A Necrotic Amount: Fat Layer (Subcutaneous Tissue) Fat Layer (Subcutaneous Tissue) N/A Exposed Structures: Exposed: Yes Exposed: Yes Fascia: No Fascia:  No Tendon: No Tendon: No Muscle: No Muscle: No Joint: No Joint: No Bone: No Bone: No None None N/A Epithelialization: Treatment Notes Wound #1 (Right Axilla) 1. Cleanse With Wound Cleanser 3. Primary Dressing Applied Other primary dressing (specifiy in notes) 4. Secondary Dressing ABD Pad Dry Gauze 5. Secured With Tape Notes anacept wet to dry in clinic Wound #2 (Left, Proximal Gluteus) 1. Cleanse With Wound Cleanser 3. Primary Dressing Applied Other primary dressing (specifiy in notes) 4. Secondary Dressing ABD Pad Dry Gauze 5. Secured With Tape Notes anacept wet to dry in clinic Wound #3 (Left Gluteus) 1. Cleanse With Wound Cleanser 3. Primary Dressing Applied Other primary dressing (specifiy in notes) 4. Secondary Dressing ABD Pad Dry Gauze 5. Secured With Tape Notes anacept wet to dry in clinic Wound #4 (Left, Medial Gluteus) 1. Cleanse With Wound Cleanser 3. Primary Dressing Applied Other primary dressing (specifiy in notes) 4. Secondary Dressing ABD Pad Dry Gauze 5. Secured With Tape Notes anacept wet to dry in clinic Wound #5 (Right, Lateral Gluteus) 1. Cleanse With Wound Cleanser 3. Primary Dressing Applied Other primary dressing (specifiy in notes) 4. Secondary Dressing ABD Pad Dry Gauze 5. Secured With Tape Notes anacept wet to dry in clinic Electronic Signature(s) Signed: 09/12/2019 5:52:05 PM By: Baltazar Najjar MD Signed: 09/13/2019 4:36:43 PM By: Yevonne Pax RN Entered By: Baltazar Najjar on 09/12/2019 15:27:07 -------------------------------------------------------------------------------- Multi-Disciplinary Care Plan Details Patient Name: Date of Service: [facility] [name], Martin [name]. 09/12/2019 1:15 PM Medical Record Number: [id number] Patient Account Number: [id number] Date of Birth/Sex: Treating RN: 1975/11/30 (43 y.o. Male) Yevonne Pax Primary Care Laverne Klugh: Bertram Denver Other Clinician: Referring Johnette Teigen: Treating  Brooklynn Brandenburg/Extender: Davene Costain, Fredia Beets in Treatment: 0 Active Inactive Electronic Signature(s) Signed: 11/03/2019 5:44:19 PM By: Yevonne Pax RN Previous Signature: 09/13/2019 4:36:43 PM Version By: Yevonne Pax RN Entered By: Yevonne Pax on 10/30/2019 15:50:05 -------------------------------------------------------------------------------- Pain Assessment Details Patient Name: Date of Service: Martin [name], Martin [name]. 09/12/2019 1:15 PM Medical Record Number: [id number] Patient Account Number: [id number] Date of Birth/Sex: Treating RN: [birth year] (43 y.o. Male) Yevonne Pax Primary Care Kayla Weekes: Bertram Denver Other Clinician: Referring Brycen Bean: Treating Nai Borromeo/Extender: Neale Burly in Treatment: 0 Active Problems Location of Pain Severity and Description of Pain Patient Has Paino Yes Site Locations Pain Location: Pain in Ulcers With Dressing Change: Yes Duration of the Pain. Constant / Intermittento Intermittent Character of Pain Describe the Pain: Burning Pain Management and Medication Current Pain Management: Medication: Yes Cold Application: No Rest: No Massage: No Activity: No FischerE.N.S.: No Heat Application: No Leg drop or elevation: No Is the Current Pain  Management Adequate: Adequate How does your wound impact your activities of daily livingo Sleep: No Bathing: No Appetite: No Relationship With Others: No Bladder Continence: No Emotions: No Bowel Continence: No Work: No Toileting: No Drive: No Dressing: No Hobbies: No Electronic Signature(s) Signed: 09/13/2019 4:36:43 PM By: Yevonne Pax RN Signed: 09/14/2019 9:12:52 AM By: Karl Ito Entered By: Karl Ito on 09/12/2019 14:06:45 -------------------------------------------------------------------------------- Patient/Caregiver Education Details Patient Name: Date of Service: Martin [name], Martin [name]. 6/1/2021andnbsp1:15 PM Medical Record Number:  [id number] Patient Account Number: [id number] Date of Birth/Gender: Treating RN: 01/31/1976 (43 y.o. Male) Yevonne Pax Primary Care Physician: Bertram Denver Other Clinician: Referring Physician: Treating Physician/Extender: Neale Burly in Treatment: 0 Education Assessment Education Provided To: Patient Education Topics Provided Wound/Skin Impairment: Methods: Explain/Verbal Responses: State content correctly Electronic Signature(s) Signed: 09/13/2019 4:36:43 PM By: Yevonne Pax RN Entered By: Yevonne Pax on 09/12/2019 14:49:47 -------------------------------------------------------------------------------- Wound Assessment Details Patient Name: Date of Service: Martin [name], Martin [name]. 09/12/2019 1:15 PM Medical Record Number: [id number] Patient Account Number: [id number] Date of Birth/Sex: Treating RN: March 09, 1976 (43 y.o. Male) Yevonne Pax Primary Care Isabellamarie Randa: Bertram Denver Other Clinician: Referring Johncharles Fusselman: Treating Mccabe Gloria/Extender: Davene Costain, Shon Hale Weeks in Treatment: 0 Wound Status Wound Number: 1 Primary Etiology: Hidradenitis Wound Location: Right Axilla Wound Status: Open Wounding Event: Gradually Appeared Comorbid History: Anemia Date Acquired: 04/13/2018 Weeks Of Treatment: 0 Clustered Wound: No Photos Photo Uploaded By: Benjaman Kindler on 09/13/2019 09:36:08 Wound Measurements Length: (cm) 1.5 Width: (cm) 4.7 Depth: (cm) 0.1 Area: (cm) 5.537 Volume: (cm) 0.554 % Reduction in Area: 0% % Reduction in Volume: 0% Epithelialization: None Tunneling: No Undermining: No Wound Description Classification: Full Thickness Without Exposed Support Structures Wound Margin: Well defined, not attached Exudate Amount: Medium Exudate Type: Serosanguineous Exudate Color: red, brown Foul Odor After Cleansing: No Slough/Fibrino No Wound Bed Granulation Amount: Large (67-100%) Exposed Structure Granulation Quality:  Red Fascia Exposed: No Necrotic Amount: None Present (0%) Fat Layer (Subcutaneous Tissue) Exposed: Yes Tendon Exposed: No Muscle Exposed: No Joint Exposed: No Bone Exposed: No Electronic Signature(s) Signed: 09/13/2019 4:36:43 PM By: Yevonne Pax RN Signed: 09/14/2019 9:12:52 AM By: Karl Ito Entered By: Karl Ito on 09/12/2019 14:11:26 -------------------------------------------------------------------------------- Wound Assessment Details Patient Name: Date of Service: Martin [name], Martin [name]. 09/12/2019 1:15 PM Medical Record Number: [id number] Patient Account Number: [id number] Date of Birth/Sex: Treating RN: 1975-05-20 (43 y.o. Male) Yevonne Pax Primary Care Yanitza Shvartsman: Bertram Denver Other Clinician: Referring Billie Trager: Treating Essica Kiker/Extender: Davene Costain, Shon Hale Weeks in Treatment: 0 Wound Status Wound Number: 2 Primary Etiology: Hidradenitis Wound Location: Left, Proximal Gluteus Wound Status: Open Wounding Event: Gradually Appeared Comorbid History: Anemia Date Acquired: 04/13/2018 Weeks Of Treatment: 0 Clustered Wound: No Photos Photo Uploaded By: Benjaman Kindler on 09/13/2019 09:36:08 Wound Measurements Length: (cm) 3.2 Width: (cm) 3 Depth: (cm) 0.1 Area: (cm) 7.54 Volume: (cm) 0.754 % Reduction in Area: 0% % Reduction in Volume: 0% Epithelialization: None Tunneling: No Undermining: No Wound Description Classification: Full Thickness Without Exposed Support Structures Wound Margin: Well defined, not attached Exudate Amount: Medium Exudate Type: Serosanguineous Exudate Color: red, brown Foul Odor After Cleansing: No Slough/Fibrino No Wound Bed Granulation Amount: Large (67-100%) Exposed Structure Granulation Quality: Red Fascia Exposed: No Necrotic Amount: None Present (0%) Fat Layer (Subcutaneous Tissue) Exposed: Yes Tendon Exposed: No Muscle Exposed: No Joint Exposed: No Bone Exposed: No Electronic Signature(s) Signed:  09/13/2019 4:36:43 PM By: Yevonne Pax RN Signed: 09/14/2019 9:12:52 AM By: Karl Ito Entered By: Nelly Rout,  Destiny on 09/12/2019 14:11:17 -------------------------------------------------------------------------------- Wound Assessment Details Patient Name: Date of Service: Martin [name], Martin [name]. 09/12/2019 1:15 PM Medical Record Number: [id number] Patient Account Number: [id number] Date of Birth/Sex: Treating RN: 02-Sep-1975 (44 y.o. Male) Carlene Coria Primary Care Dionna Wiedemann: Geryl Rankins Other Clinician: Referring Toshia Larkin: Treating Theoden Mauch/Extender: Tomasa Blase, Army Melia Weeks in Treatment: 0 Wound Status Wound Number: 3 Primary Etiology: Hidradenitis Wound Location: Left Gluteus Wound Status: Open Wounding Event: Gradually Appeared Comorbid History: Anemia Date Acquired: 04/13/2018 Weeks Of Treatment: 0 Clustered Wound: No Photos Photo Uploaded By: Mikeal Hawthorne on 09/13/2019 09:36:44 Wound Measurements Length: (cm) 0.8 Width: (cm) 0.5 Depth: (cm) 0.5 Area: (cm) 0.314 Volume: (cm) 0.157 % Reduction in Area: % Reduction in Volume: Epithelialization: None Tunneling: No Undermining: No Wound Description Classification: Full Thickness Without Exposed Support Structures Wound Margin: Well defined, not attached Exudate Amount: Medium Exudate Type: Serosanguineous Exudate Color: red, brown Foul Odor After Cleansing: No Slough/Fibrino No Wound Bed Granulation Amount: Large (67-100%) Exposed Structure Granulation Quality: Red Fascia Exposed: No Necrotic Amount: None Present (0%) Fat Layer (Subcutaneous Tissue) Exposed: Yes Tendon Exposed: No Muscle Exposed: No Joint Exposed: No Bone Exposed: No Electronic Signature(s) Signed: 09/13/2019 4:36:43 PM By: Carlene Coria RN Signed: 09/14/2019 9:12:52 AM By: Sandre Kitty Entered By: Sandre Kitty on 09/12/2019  14:11:05 -------------------------------------------------------------------------------- Wound Assessment Details Patient Name: Date of Service: Martin [name], [place] THA NIEL [name]. 09/12/2019 1:15 PM Medical Record Number: [id number] Patient Account Number: [id number] Date of Birth/Sex: Treating RN: 07-14-1975 (44 y.o. Male) Carlene Coria Primary Care Moniqua Engebretsen: Geryl Rankins Other Clinician: Referring Larya Charpentier: Treating [place] Spees/Extender: Tomasa Blase, Army Melia Weeks in Treatment: 0 Wound Status Wound Number: 4 Primary Etiology: Hidradenitis Wound Location: Left, Medial Gluteus Wound Status: Open Wounding Event: Gradually Appeared Comorbid History: Anemia Date Acquired: 04/13/2018 Weeks Of Treatment: 0 Clustered Wound: No Photos Photo Uploaded By: Mikeal Hawthorne on 09/13/2019 09:36:45 Wound Measurements Length: (cm) 1 Width: (cm) 0.2 Depth: (cm) 0.9 Area: (cm) 0.157 Volume: (cm) 0.141 % Reduction in Area: % Reduction in Volume: Epithelialization: None Tunneling: No Undermining: No Wound Description Classification: Full Thickness Without Exposed Support Structures Wound Margin: Well defined, not attached Exudate Amount: Medium Exudate Type: Serosanguineous Exudate Color: red, brown Foul Odor After Cleansing: No Slough/Fibrino No Wound Bed Granulation Amount: Large (67-100%) Exposed Structure Granulation Quality: Red Fascia Exposed: No Necrotic Amount: None Present (0%) Fat Layer (Subcutaneous Tissue) Exposed: Yes Tendon Exposed: No Muscle Exposed: No Joint Exposed: No Bone Exposed: No Electronic Signature(s) Signed: 09/13/2019 4:36:43 PM By: Carlene Coria RN Signed: 09/14/2019 9:12:52 AM By: Sandre Kitty Entered By: Sandre Kitty on 09/12/2019 14:12:40 -------------------------------------------------------------------------------- Wound Assessment Details Patient Name: Date of Service: Martin [name], [place] THA NIEL [name]. 09/12/2019 1:15 PM Medical Record Number:  [id number] Patient Account Number: [id number] Date of Birth/Sex: Treating RN: 04-Dec-1975 (43 y.o. Male) Carlene Coria Primary Care Docia Klar: Geryl Rankins Other Clinician: Referring Helyn Schwan: Treating Xariah Silvernail/Extender: Tomasa Blase, Army Melia Weeks in Treatment: 0 Wound Status Wound Number: 5 Primary Etiology: Hidradenitis Wound Location: Right, Lateral Gluteus Wound Status: Open Wounding Event: Gradually Appeared Comorbid History: Anemia Date Acquired: 04/13/2018 Weeks Of Treatment: 0 Clustered Wound: No Photos Photo Uploaded By: Mikeal Hawthorne on 09/13/2019 09:37:13 Wound Measurements Length: (cm) 0.1 Width: (cm) 0.8 Depth: (cm) 0.8 Area: (cm) 0.063 Volume: (cm) 0.05 % Reduction in Area: % Reduction in Volume: Epithelialization: None Tunneling: No Undermining: No Wound Description Classification: Full Thickness Without Exposed Support Str Wound Margin: Well defined, not attached Exudate Amount: Medium Exudate Type: Serosanguineous Exudate  Color: red, brown uctures Wound Bed Granulation Amount: Large (67-100%) Exposed Structure Granulation Quality: Red Fascia Exposed: No Necrotic Amount: None Present (0%) Fat Layer (Subcutaneous Tissue) Exposed: Yes Tendon Exposed: No Muscle Exposed: No Joint Exposed: No Bone Exposed: No Electronic Signature(s) Signed: 09/13/2019 4:36:43 PM By: Yevonne PaxEpps, Carrie RN Signed: 09/14/2019 9:12:52 AM By: Karl Itoawkins, Destiny Entered By: Karl Itoawkins, Destiny on 09/12/2019 14:13:36 -------------------------------------------------------------------------------- Vitals Details Patient Name: Date of Service: Martin [name], Martin [name]. 09/12/2019 1:15 PM Medical Record Number: [id number] Patient Account Number: [id number] Date of Birth/Sex: Treating RN: 1975-11-26 (44 y.o. Male) Yevonne PaxEpps, Carrie Primary Care Ulysess Witz: Bertram DenverFleming, Zelda Other Clinician: Referring Tabetha Haraway: Treating Yeudiel Mateo/Extender: Davene Costainobson, Michael Fleming, Fredia BeetsZelda Weeks in Treatment:  0 Vital Signs Time Taken: 13:42 Temperature (F): 99.1 Height (in): 73 Pulse (bpm): 89 Source: Stated Respiratory Rate (breaths/min): 18 Weight (lbs): 220 Blood Pressure (mmHg): 112/67 Source: Stated Reference Range: 80 - 120 mg / dl Body Mass Index (BMI): 29 Electronic Signature(s) Signed: 09/14/2019 9:12:52 AM By: Karl Itoawkins, Destiny Entered By: Karl Itoawkins, Destiny on 09/12/2019 13:51:15

## 2019-09-14 NOTE — Progress Notes (Signed)
Martin [name], Martin [name] ([id number]) . Visit Report for 09/12/2019 Chief Complaint Document Details Patient Name: Date of Service: Martin [name], [place] Martin [name]. 09/12/2019 1:15 PM Medical Record Number: [id number] Patient Account Number: [id number] Date of Birth/Sex: Treating RN: 1976/02/21 (44 y.o. Male) Yevonne PaxEpps, Carrie Primary Care Provider: Bertram DenverFleming, Zelda Other Clinician: Referring Provider: Treating Provider/Extender: Davene Costainobson, Shimeka Bacot Fleming, Fredia BeetsZelda Weeks in Treatment: 0 Information Obtained from: Patient Chief Complaint 09/12/2019; patient is referred here for wounds related to hidradenitis in the left greater than right buttock and right axilla Electronic Signature(s) Signed: 09/12/2019 5:52:05 PM By: Baltazar Najjarobson, Isaiha Asare MD Entered By: Baltazar Najjarobson, Mackenzy Eisenberg on 09/12/2019 15:28:01 -------------------------------------------------------------------------------- HPI Details Patient Name: Date of Service: Martin [name], Martin Martin [name]. 09/12/2019 1:15 PM Medical Record Number: [id number] Patient Account Number: [id number] Date of Birth/Sex: Treating RN: 1976/02/21 (43 y.o. Male) Yevonne PaxEpps, Carrie Primary Care Provider: Bertram DenverFleming, Zelda Other Clinician: Referring Provider: Treating Provider/Extender: Davene Costainobson, Belita Warsame Fleming, Fredia BeetsZelda Weeks in Treatment: 0 History of Present Illness HPI Description: ADMISSION 09/12/2019 This is a 44 year old man who has severe hidradenitis suppurativa. This was initially diagnosed in his 2620s. [facility] and over the years he was [facility] [place] stage III hidradenitis by Dr. Robin SearingMacGregor of dermatology. He was also followed by the wound care center at [facility] predominantly using Vashe solution for extensive wounds on his buttocks. He was incarcerated for 2 years only recently getting out on his own. He has remained on Humira injections weekly that continued while he was in jail. He has wounds in the right axilla, left glued glued,  right lateral glued. He was seen by his primary doctor who referred him here also to the hidraden[facility] clinic at [facility] where there is apparently a Careers advisersurgeon who specializes in this type of debridement. He has been using a dry dressing although he tells me he has both Vache solution and Dakin's solution at home. Past medical history includes hidradenitis suppurativa. He also has iron deficiency anemia although the exact cause of his iron deficiency is not really clear. Electronic Signature(s) Signed: 09/12/2019 5:52:05 PM By: Baltazar Najjarobson, Iver Fehrenbach MD Entered By: Baltazar Najjarobson, Terah Robey on 09/12/2019 15:33:04 -------------------------------------------------------------------------------- Physical Exam Details Patient Name: Date of Service: Martin [name], Martin Martin [name]. 09/12/2019 1:15 PM Medical Record Number: [id number] Patient Account Number: [id number] Date of Birth/Sex: Treating RN: 1976/02/21 (43 y.o. Male) Yevonne PaxEpps, Carrie Primary Care Provider: Bertram DenverFleming, Zelda Other Clinician: Referring Provider: Treating Provider/Extender: Davene Costainobson, Skyeler Scalese Fleming, Fredia BeetsZelda Weeks in Treatment: 0 Constitutional Sitting or standing Blood Pressure is within target range for patient.. Pulse regular and within target range for patient.Marland Kitchen. Respirations regular, non-labored and within target range.. Temperature is normal and within the target range for the patient.Marland Kitchen. Appears in no distress. Respiratory work of breathing is normal. Cardiovascular Heart rhythm and rate regular, without murmur or gallop.Marland Kitchen. Psychiatric appears at normal baseline. Notes Wound exam; the patient has substantial damage in the right buttock with wounds laterally that undermining connecting widely under the skin on the buttock. There is 3 tunneling areas on the left glued with purulent drainage. An area on the right lateral gluteal though this not nearly as bad as on the left and an area medially on the left. He also has substantial areas in the right axilla with  undermining and tunneling. Electronic Signature(s) Signed: 09/12/2019 5:52:05 PM By: Baltazar Najjarobson, Caliph Borowiak MD Entered By: Baltazar Najjarobson, Janaa Acero on 09/12/2019 15:34:17 -------------------------------------------------------------------------------- Physician Orders Details Patient Name: Date of Service: Martin [name], Martin Martin NIEL  [name]. 09/12/2019 1:15 PM Medical Record Number: [id number] Patient Account Number: [id number] Date of Birth/Sex: Treating RN: 02-Aug-1975 (43 y.o. Male) Yevonne Pax Primary Care Provider: Bertram Denver Other Clinician: Referring Provider: Treating Provider/Extender: Neale Burly in Treatment: 0 Verbal / Phone Orders: No Diagnosis Coding Follow-up Appointments Return Appointment in 2 weeks. Dressing Change Frequency Wound #1 Right Axilla Change dressing every day. Wound #2 Left,Proximal Gluteus Change dressing every day. Wound #3 Left Gluteus Change dressing every day. Wound #4 Left,Medial Gluteus Change dressing every day. Wound #5 Right,Lateral Gluteus Change dressing every day. Wound Cleansing Wound #1 Right Axilla May shower and wash wound with soap and water. Wound #2 Left,Proximal Gluteus May shower and wash wound with soap and water. Wound #3 Left Gluteus May shower and wash wound with soap and water. Wound #4 Left,Medial Gluteus May shower and wash wound with soap and water. Wound #5 Right,Lateral Gluteus May shower and wash wound with soap and water. Primary Wound Dressing Wound #1 Right Axilla Other: - Vashe moist saline gauze , pack wounds Wound #2 Left,Proximal Gluteus Other: - Vashe moist saline gauze , pack wounds Wound #3 Left Gluteus Other: - Vashe moist saline gauze , pack wounds Wound #4 Left,Medial Gluteus Other: - Vashe moist saline gauze , pack wounds Wound #5 Right,Lateral Gluteus Other: - Vashe moist saline gauze , pack wounds Secondary Dressing Wound #1 Right Axilla Dry Gauze ABD pad Wound #2 Left,Proximal  Gluteus Dry Gauze ABD pad Wound #3 Left Gluteus Dry Gauze ABD pad Wound #4 Left,Medial Gluteus Dry Gauze ABD pad Wound #5 Right,Lateral Gluteus Dry Gauze ABD pad Electronic Signature(s) Signed: 09/12/2019 5:52:05 PM By: Baltazar Najjar MD Signed: 09/13/2019 4:36:43 PM By: Yevonne Pax RN Entered By: Yevonne Pax on 09/12/2019 14:54:09 -------------------------------------------------------------------------------- Problem List Details Patient Name: Date of Service: Martin [name], Martin Martin [name]. 09/12/2019 1:15 PM Medical Record Number: [id number] Patient Account Number: [id number] Date of Birth/Sex: Treating RN: 1975/05/31 (44 y.o. Male) Yevonne Pax Primary Care Provider: Bertram Denver Other Clinician: Referring Provider: Treating Provider/Extender: Davene Costain, Fredia Beets in Treatment: 0 Active Problems ICD-10 Encounter Code Description Active Date MDM Diagnosis L73.2 Hidradenitis suppurativa 09/12/2019 No Yes L98.498 Non-pressure chronic ulcer of skin of other sites with other specified severity 09/12/2019 No Yes L02.411 Cutaneous abscess of right axilla 09/12/2019 No Yes Inactive Problems Resolved Problems Electronic Signature(s) Signed: 09/12/2019 5:52:05 PM By: Baltazar Najjar MD Entered By: Baltazar Najjar on 09/12/2019 15:09:05 -------------------------------------------------------------------------------- Progress Note Details Patient Name: Date of Service: Martin [name], Martin Martin [name]. 09/12/2019 1:15 PM Medical Record Number: [id number] Patient Account Number: [id number] Date of Birth/Sex: Treating RN: 08-16-1975 (43 y.o. Male) Yevonne Pax Primary Care Provider: Bertram Denver Other Clinician: Referring Provider: Treating Provider/Extender: Davene Costain, Fredia Beets in Treatment: 0 Subjective Chief Complaint Information obtained from Patient 09/12/2019; patient is referred here for wounds related to hidradenitis in the left greater than right buttock and  right axilla History of Present Illness (HPI) ADMISSION 09/12/2019 This is a 44 year old man who has severe hidradenitis suppurativa. This was initially diagnosed in his 22s. He had groin surgery in 2015 at [facility] and over the years he was followed by dermatology at [facility] for [place] stage III hidradenitis by Dr. Robin Searing of dermatology. He was also followed by the wound care center at [facility] predominantly using Vashe solution for extensive wounds on his buttocks. He was incarcerated for 2 years only recently getting out on his own. He has remained on Humira injections  weekly that continued while he was in jail. He has wounds in the right axilla, left glued glued, right lateral glued. He was seen by his primary doctor who referred him here also to the hidradenitis clinic at [facility] where there is apparently a Careers adviser who specializes in this type of debridement. He has been using a dry dressing although he tells me he has both Vache solution and Dakin's solution at home. Past medical history includes hidradenitis suppurativa. He also has iron deficiency anemia although the exact cause of his iron deficiency is not really clear. Patient History Information obtained from Patient. Allergies No Known Drug Allergies Family History No family history of Cancer, Diabetes, Heart Disease, Hereditary Spherocytosis, Hypertension, Kidney Disease, Lung Disease, Seizures, Stroke, Thyroid Problems, Tuberculosis. Social History Never smoker, Marital Status - Single, Alcohol Use - Moderate, Drug Use - No History, Caffeine Use - Moderate - tea. Medical History Hematologic/Lymphatic Patient has history of Anemia Medical A Surgical History Notes nd Integumentary (Skin) Hidradenitis Review of Systems (ROS) Constitutional Symptoms (General Health) Denies complaints or symptoms of Fatigue, Fever, Chills, Marked Weight Change. Eyes Denies complaints or symptoms of Dry Eyes, Vision Changes, Glasses  / Contacts. Ear/Nose/Mouth/Throat Denies complaints or symptoms of Chronic sinus problems or rhinitis. Respiratory Denies complaints or symptoms of Chronic or frequent coughs, Shortness of Breath. Cardiovascular Denies complaints or symptoms of Chest pain. Gastrointestinal Denies complaints or symptoms of Frequent diarrhea, Nausea, Vomiting. Endocrine Denies complaints or symptoms of Heat/cold intolerance. Genitourinary Denies complaints or symptoms of Frequent urination. Integumentary (Skin) Complains or has symptoms of Wounds. Musculoskeletal Denies complaints or symptoms of Muscle Pain, Muscle Weakness. Neurologic Denies complaints or symptoms of Numbness/parasthesias. Psychiatric Denies complaints or symptoms of Claustrophobia, Suicidal. Objective Constitutional Sitting or standing Blood Pressure is within target range for patient.. Pulse regular and within target range for patient.Marland Kitchen Respirations regular, non-labored and within target range.. Temperature is normal and within the target range for the patient.Marland Kitchen Appears in no distress. Vitals Time Taken: 1:42 PM, Height: 73 in, Source: Stated, Weight: 220 lbs, Source: Stated, BMI: 29, Temperature: 99.1 F, Pulse: 89 bpm, Respiratory Rate: 18 breaths/min, Blood Pressure: 112/67 mmHg. Respiratory work of breathing is normal. Cardiovascular Heart rhythm and rate regular, without murmur or gallop.Marland Kitchen Psychiatric appears at normal baseline. General Notes: Wound exam; the patient has substantial damage in the right buttock with wounds laterally that undermining connecting widely under the skin on the buttock. There is 3 tunneling areas on the left glued with purulent drainage. An area on the right lateral gluteal though this not nearly as bad as on the left and an area medially on the left. He also has substantial areas in the right axilla with undermining and tunneling. Integumentary (Hair, Skin) Wound #1 status is Open. Original  cause of wound was Gradually Appeared. The wound is located on the Right Axilla. The wound measures 1.5cm length x 4.7cm width x 0.1cm depth; 5.537cm^2 area and 0.554cm^3 volume. There is Fat Layer (Subcutaneous Tissue) Exposed exposed. There is no tunneling or undermining noted. There is a medium amount of serosanguineous drainage noted. The wound margin is well defined and not attached to the wound base. There is large (67-100%) red granulation within the wound bed. There is no necrotic tissue within the wound bed. Wound #2 status is Open. Original cause of wound was Gradually Appeared. The wound is located on the Left,Proximal Gluteus. The wound measures 3.2cm length x 3cm width x 0.1cm depth; 7.54cm^2 area and 0.754cm^3 volume. There is Fat Layer (  Subcutaneous Tissue) Exposed exposed. There is no tunneling or undermining noted. There is a medium amount of serosanguineous drainage noted. The wound margin is well defined and not attached to the wound base. There is large (67-100%) red granulation within the wound bed. There is no necrotic tissue within the wound bed. Wound #3 status is Open. Original cause of wound was Gradually Appeared. The wound is located on the Left Gluteus. The wound measures 0.8cm length x 0.5cm width x 0.5cm depth; 0.314cm^2 area and 0.157cm^3 volume. There is Fat Layer (Subcutaneous Tissue) Exposed exposed. There is no tunneling or undermining noted. There is a medium amount of serosanguineous drainage noted. The wound margin is well defined and not attached to the wound base. There is large (67-100%) red granulation within the wound bed. There is no necrotic tissue within the wound bed. Wound #4 status is Open. Original cause of wound was Gradually Appeared. The wound is located on the Left,Medial Gluteus. The wound measures 1cm length x 0.2cm width x 0.9cm depth; 0.157cm^2 area and 0.141cm^3 volume. There is Fat Layer (Subcutaneous Tissue) Exposed exposed. There is no  tunneling or undermining noted. There is a medium amount of serosanguineous drainage noted. The wound margin is well defined and not attached to the wound base. There is large (67-100%) red granulation within the wound bed. There is no necrotic tissue within the wound bed. Wound #5 status is Open. Original cause of wound was Gradually Appeared. The wound is located on the Right,Lateral Gluteus. The wound measures 0.1cm length x 0.8cm width x 0.8cm depth; 0.063cm^2 area and 0.05cm^3 volume. There is Fat Layer (Subcutaneous Tissue) Exposed exposed. There is no tunneling or undermining noted. There is a medium amount of serosanguineous drainage noted. The wound margin is well defined and not attached to the wound base. There is large (67-100%) red granulation within the wound bed. There is no necrotic tissue within the wound bed. Assessment Active Problems ICD-10 Hidradenitis suppurativa Non-pressure chronic ulcer of skin of other sites with other specified severity Cutaneous abscess of right axilla Plan Follow-up Appointments: Return Appointment in 2 weeks. Dressing Change Frequency: Wound #1 Right Axilla: Change dressing every day. Wound #2 Left,Proximal Gluteus: Change dressing every day. Wound #3 Left Gluteus: Change dressing every day. Wound #4 Left,Medial Gluteus: Change dressing every day. Wound #5 Right,Lateral Gluteus: Change dressing every day. Wound Cleansing: Wound #1 Right Axilla: May shower and wash wound with soap and water. Wound #2 Left,Proximal Gluteus: May shower and wash wound with soap and water. Wound #3 Left Gluteus: May shower and wash wound with soap and water. Wound #4 Left,Medial Gluteus: May shower and wash wound with soap and water. Wound #5 Right,Lateral Gluteus: May shower and wash wound with soap and water. Primary Wound Dressing: Wound #1 Right Axilla: Other: - Vashe moist saline gauze , pack wounds Wound #2 Left,Proximal Gluteus: Other: - Vashe  moist saline gauze , pack wounds Wound #3 Left Gluteus: Other: - Vashe moist saline gauze , pack wounds Wound #4 Left,Medial Gluteus: Other: - Vashe moist saline gauze , pack wounds Wound #5 Right,Lateral Gluteus: Other: - Vashe moist saline gauze , pack wounds Secondary Dressing: Wound #1 Right Axilla: Dry Gauze ABD pad Wound #2 Left,Proximal Gluteus: Dry Gauze ABD pad Wound #3 Left Gluteus: Dry Gauze ABD pad Wound #4 Left,Medial Gluteus: Dry Gauze ABD pad Wound #5 Right,Lateral Gluteus: Dry Gauze ABD pad 1. There were not a lot of options available to what could be done to dress these wounds. Anything  from a purely topical wound care point of view would be prohibitively expensive from a Medicaid point of view. I therefore told him to use the Vashe solution that he had it was given to him when he left prison. We used Anasept gel here 2. I actually think this man probably needs surgery on the left buttock to try and clean things out. There is white connections under the skin and subcutaneous tissue on his buttock. He does not appear to be medically unwell at this point however. 3. Option to treat Grafix would include Dakin's wet-to-dry which is usually fairly inexpensive. 4. He was started on doxycycline for a month with renewals by his primary doctor. I think this is reasonable until he can get into the hidradenitis clinic at [facility]. Hopefully this is in process. I asked him to actually call to see if his name is on the list 5. Follow-up in 2 weeks Electronic Signature(s) Signed: 09/12/2019 5:52:05 PM By: Baltazar Najjar MD Entered By: Baltazar Najjar on 09/12/2019 15:36:42 -------------------------------------------------------------------------------- HxROS Details Patient Name: Date of Service: Martin [name], Martin Martin [name]. 09/12/2019 1:15 PM Medical Record Number: [id number] Patient Account Number: [id number] Date of Birth/Sex: Treating RN: [birth year] (43 y.o. Male) Yevonne Pax Primary Care Provider: Other Clinician: Bertram Denver Referring Provider: Treating Provider/Extender: Neale Burly in Treatment: 0 Information Obtained From Patient Constitutional Symptoms (General Health) Complaints and Symptoms: Negative for: Fatigue; Fever; Chills; Marked Weight Change Eyes Complaints and Symptoms: Negative for: Dry Eyes; Vision Changes; Glasses / Contacts Ear/Nose/Mouth/Throat Complaints and Symptoms: Negative for: Chronic sinus problems or rhinitis Respiratory Complaints and Symptoms: Negative for: Chronic or frequent coughs; Shortness of Breath Cardiovascular Complaints and Symptoms: Negative for: Chest pain Gastrointestinal Complaints and Symptoms: Negative for: Frequent diarrhea; Nausea; Vomiting Endocrine Complaints and Symptoms: Negative for: Heat/cold intolerance Genitourinary Complaints and Symptoms: Negative for: Frequent urination Integumentary (Skin) Complaints and Symptoms: Positive for: Wounds Medical History: Past Medical History Notes: Hidradenitis Musculoskeletal Complaints and Symptoms: Negative for: Muscle Pain; Muscle Weakness Neurologic Complaints and Symptoms: Negative for: Numbness/parasthesias Psychiatric Complaints and Symptoms: Negative for: Claustrophobia; Suicidal Hematologic/Lymphatic Medical History: Positive for: Anemia Immunological Oncologic Immunizations Pneumococcal Vaccine: Received Pneumococcal Vaccination: No Implantable Devices None Family and Social History Cancer: No; Diabetes: No; Heart Disease: No; Hereditary Spherocytosis: No; Hypertension: No; Kidney Disease: No; Lung Disease: No; Seizures: No; Stroke: No; Thyroid Problems: No; Tuberculosis: No; Never smoker; Marital Status - Single; Alcohol Use: Moderate; Drug Use: No History; Caffeine Use: Moderate - tea; Financial Concerns: No; Food, Clothing or Shelter Needs: No; Support System Lacking: No; Transportation  Concerns: No Electronic Signature(s) Signed: 09/12/2019 5:52:05 PM By: Baltazar Najjar MD Signed: 09/13/2019 4:36:43 PM By: Yevonne Pax RN Signed: 09/14/2019 9:12:52 AM By: Karl Ito Entered By: Karl Ito on 09/12/2019 13:57:14 -------------------------------------------------------------------------------- SuperBill Details Patient Name: Date of Service: Martin [name], Martin Martin [name]. 09/12/2019 Medical Record Number: [id number] Patient Account Number: [id number] Date of Birth/Sex: Treating RN: Aug 27, 1975 (43 y.o. Male) Yevonne Pax Primary Care Provider: Bertram Denver Other Clinician: Referring Provider: Treating Provider/Extender: Davene Costain, Fredia Beets in Treatment: 0 Diagnosis Coding ICD-10 Codes Code Description L73.2 Hidradenitis suppurativa L98.498 Non-pressure chronic ulcer of skin of other sites with other specified severity L02.411 Cutaneous abscess of right axilla Facility Procedures CPT4 Code: [id number] Description: 85277 - WOUND CARE VISIT-LEV 5 EST PT Modifier: Quantity: 1 Physician Procedures : CPT4 Code Description Modifier [id number] WC PHYS LEVEL 3 NEW PT ICD-10 Diagnosis Description L73.2 Hidradenitis suppurativa L98.498 Non-pressure chronic ulcer of  skin of other sites with other specified severity L02.411 Cutaneous abscess of right axilla Quantity: 1 Electronic Signature(s) Signed: 09/12/2019 5:52:05 PM By: Linton Ham MD Signed: 09/13/2019 4:36:43 PM By: Carlene Coria RN Entered By: Carlene Coria on 09/12/2019 16:14:51

## 2019-09-26 ENCOUNTER — Encounter (HOSPITAL_BASED_OUTPATIENT_CLINIC_OR_DEPARTMENT_OTHER): Payer: Medicaid Other | Admitting: Internal Medicine

## 2019-09-28 ENCOUNTER — Encounter (HOSPITAL_BASED_OUTPATIENT_CLINIC_OR_DEPARTMENT_OTHER): Payer: Medicaid Other | Admitting: Internal Medicine

## 2019-10-03 ENCOUNTER — Encounter (HOSPITAL_BASED_OUTPATIENT_CLINIC_OR_DEPARTMENT_OTHER): Payer: Medicaid Other | Admitting: Internal Medicine

## 2019-11-09 ENCOUNTER — Telehealth: Payer: Self-pay | Admitting: Nurse Practitioner

## 2019-11-09 ENCOUNTER — Other Ambulatory Visit: Payer: Self-pay | Admitting: Nurse Practitioner

## 2019-11-09 DIAGNOSIS — L732 Hidradenitis suppurativa: Secondary | ICD-10-CM

## 2019-11-09 MED ORDER — DOXYCYCLINE HYCLATE 100 MG PO TABS
100.0000 mg | ORAL_TABLET | Freq: Two times a day (BID) | ORAL | 0 refills | Status: AC
Start: 2019-11-09 — End: 2019-12-09

## 2019-11-09 NOTE — Telephone Encounter (Signed)
Patient called to request that his doxycycline be sent into his local pharmacy.  He stated that there are no more refills for this medication.  Please advise and call patient if there is a problem at [phone]

## 2019-11-09 NOTE — Telephone Encounter (Signed)
Please let him know this is his last refill as he needs to be evaluated by the skin specialist.  He needs to follow up with the dermatologist. The number is below.   Per Patient request  Sent referral to   Jarome Matin, MD PH# [phone] Fax  # [phone] Address [id number] [place], [place] [address]

## 2019-11-09 NOTE — Telephone Encounter (Signed)
I do not see doxycycline on his med list. Will defer to PCP.

## 2019-11-14 NOTE — Telephone Encounter (Signed)
Spoke to patient and informed on PCP advising. Pt. Understood and stated his dermatologist appt. Is not till October.

## 2019-12-27 ENCOUNTER — Encounter: Payer: Self-pay | Admitting: Nurse Practitioner

## 2019-12-27 ENCOUNTER — Ambulatory Visit: Payer: Medicaid Other | Attending: Nurse Practitioner | Admitting: Nurse Practitioner

## 2020-11-04 ENCOUNTER — Encounter (HOSPITAL_BASED_OUTPATIENT_CLINIC_OR_DEPARTMENT_OTHER): Payer: Self-pay | Admitting: Urology

## 2020-11-04 ENCOUNTER — Emergency Department (HOSPITAL_BASED_OUTPATIENT_CLINIC_OR_DEPARTMENT_OTHER): Payer: Medicaid Other

## 2020-11-04 ENCOUNTER — Other Ambulatory Visit: Payer: Self-pay

## 2020-11-04 ENCOUNTER — Emergency Department (HOSPITAL_BASED_OUTPATIENT_CLINIC_OR_DEPARTMENT_OTHER)
Admission: EM | Admit: 2020-11-04 | Discharge: 2020-11-04 | Disposition: A | Discharge status: Home / Self Care | Payer: Medicaid Other | Attending: Emergency Medicine | Admitting: Emergency Medicine

## 2020-11-04 DIAGNOSIS — L03115 Cellulitis of right lower limb: Secondary | ICD-10-CM | POA: Diagnosis not present

## 2020-11-04 DIAGNOSIS — M7989 Other specified soft tissue disorders: Secondary | ICD-10-CM | POA: Diagnosis present

## 2020-11-04 LAB — COMPREHENSIVE METABOLIC PANEL
ALT: 19 U/L (ref 0–44)
AST: 25 U/L (ref 15–41)
Albumin: 2.5 g/dL — ABNORMAL LOW (ref 3.5–5.0)
Alkaline Phosphatase: 119 U/L (ref 38–126)
Anion gap: 5 (ref 5–15)
BUN: 14 mg/dL (ref 6–20)
CO2: 28 mmol/L (ref 22–32)
Calcium: 8 mg/dL — ABNORMAL LOW (ref 8.9–10.3)
Chloride: 104 mmol/L (ref 98–111)
Creatinine, Ser: 1.08 mg/dL (ref 0.61–1.24)
GFR, Estimated: >60 mL/min (ref >60)
Glucose, Bld: 101 mg/dL — ABNORMAL HIGH (ref 70–99)
Potassium: 4.1 mmol/L (ref 3.5–5.1)
Sodium: 137 mmol/L (ref 135–145)
Total Bilirubin: 0.3 mg/dL (ref 0.3–1.2)
Total Protein: 9.1 g/dL — ABNORMAL HIGH (ref 6.5–8.1)

## 2020-11-04 LAB — CBC WITH DIFFERENTIAL/PLATELET
Abs Immature Granulocytes: 0.07 10*3/uL (ref 0.00–0.07)
Basophils Absolute: 0 10*3/uL (ref 0.0–0.1)
Basophils Relative: 0 %
Eosinophils Absolute: 0.1 10*3/uL (ref 0.0–0.5)
Eosinophils Relative: 2 %
HCT: 35.5 % — ABNORMAL LOW (ref 39.0–52.0)
Hemoglobin: 11.1 g/dL — ABNORMAL LOW (ref 13.0–17.0)
Immature Granulocytes: 1 %
Lymphocytes Relative: 15 %
Lymphs Abs: 1.2 10*3/uL (ref 0.7–4.0)
MCH: 24.8 pg — ABNORMAL LOW (ref 26.0–34.0)
MCHC: 31.3 g/dL (ref 30.0–36.0)
MCV: 79.4 fL — ABNORMAL LOW (ref 80.0–100.0)
Monocytes Absolute: 0.6 10*3/uL (ref 0.1–1.0)
Monocytes Relative: 8 %
Neutro Abs: 6 10*3/uL (ref 1.7–7.7)
Neutrophils Relative %: 74 %
Platelets: 190 10*3/uL (ref 150–400)
RBC: 4.47 MIL/uL (ref 4.22–5.81)
RDW: 16.3 % — ABNORMAL HIGH (ref 11.5–15.5)
WBC: 8.1 10*3/uL (ref 4.0–10.5)
nRBC: 0 % (ref 0.0–0.2)

## 2020-11-04 MED ORDER — SODIUM CHLORIDE 0.9 % IV SOLN
1.0000 g | Freq: Once | INTRAVENOUS | Status: AC
  Administered 2020-11-04: 1 g via INTRAVENOUS
  Filled 2020-11-04: qty 10

## 2020-11-04 MED ORDER — AMOXICILLIN-POT CLAVULANATE 875-125 MG PO TABS: 1.0000 | ORAL_TABLET | Freq: Two times a day (BID) | ORAL | 0 refills | Status: DC

## 2020-11-04 NOTE — ED Provider Notes (Signed)
[facility] EMERGENCY DEPARTMENT Provider Note   CSN: [id number] Arrival date & time: 11/04/20  1748     History Chief Complaint  Patient presents with   Leg Swelling    Martin [name] is a 45 y.o. male.  HPI Patient states he has a history of hidradenitis suppurativa.  Patient's had complications including sepsis in the past and receives Avsola infusion therapies.  Patient's last treatment was on July 8.  Patient states he started noticing swelling in his leg 2 days ago.  He does usually wear compression stockings but had to take it off because of the increased swelling.  Patient states its been more painful and also warm to the touch.  He denies any fevers or chills.  No vomiting or diarrhea.  No weakness.  No chest pain or shortness of breath.    Past Medical History:  Diagnosis Date   Abscess of buttock    Abscess of thigh s/p I&D in past 10/26/2013   Anemia    Blood transfusion without reported diagnosis    Chicken pox    Hydradenitis     Patient Active Problem List   Diagnosis Date Noted   Anemia of chronic disease 08/25/2014   Sepsis (HCC) 08/24/2014   Chronic anemia 08/24/2014   Cellulitis of left lower extremity    Abscess of thigh s/p I&D in past 10/26/2013   Hidradenitis suppurativa of trunk, genetalia, thighs - EXTENSIVE 10/26/2013    Past Surgical History:  Procedure Laterality Date   ABCESS DRAINAGE     Unremarkable.         Family History  Problem Relation Age of Onset   Healthy Mother        Living   Diabetes Maternal Grandmother    Hypertension Maternal Grandfather    Emphysema Maternal Uncle        x2-Deceased   Other Sister        Hydradenitis    Social History   Tobacco Use   Smoking status: Never   Smokeless tobacco: Never  Substance Use Topics   Alcohol use: Yes    Alcohol/week: 8.0 standard drinks    Types: 4 Glasses of wine, 4 Shots of liquor per week    Comment: weekly   Drug use: No    Home  Medications Prior to Admission medications   Medication Sig Start Date End Date Taking? Authorizing Provider  amoxicillin-clavulanate (AUGMENTIN) 875-125 MG tablet Take 1 tablet by mouth 2 (two) times daily. 11/04/20  Yes Linwood Dibbles, MD  Adalimumab (HUMIRA PEN [place]) Inject into the skin.    [provider]  Emollient [facility] ADVANCED CARE EX) Apply 1 application topically daily as needed (for eczema).    [provider]  ferrous sulfate 325 (65 FE) MG EC tablet Take 1 tablet (325 mg total) by mouth daily with breakfast. 09/06/19   Claiborne Rigg, NP    Allergies    Patient has no known allergies.  Review of Systems   Review of Systems  All other systems reviewed and are negative.  Physical Exam Updated Vital Signs BP 114/81   Pulse 86   Temp 99.4 F (37.4 C) (Oral)   Resp 18   Ht 1.854 m (6\' 1" )   Wt 99.8 kg   SpO2 97%   BMI 29.03 kg/m   Physical Exam Vitals and nursing note reviewed.  Constitutional:      General: He is not in acute distress.    Appearance: He is well-developed.  HENT:     Head: Normocephalic and atraumatic.     Right Ear: External ear normal.     Left Ear: External ear normal.  Eyes:     General: No scleral icterus.       Right eye: No discharge.        Left eye: No discharge.     Conjunctiva/sclera: Conjunctivae normal.  Neck:     Trachea: No tracheal deviation.  Cardiovascular:     Rate and Rhythm: Normal rate and regular rhythm.  Pulmonary:     Effort: Pulmonary effort is normal. No respiratory distress.     Breath sounds: Normal breath sounds. No stridor. No wheezing or rales.  Abdominal:     General: Bowel sounds are normal. There is no distension.     Palpations: Abdomen is soft.     Tenderness: There is no abdominal tenderness. There is no guarding or rebound.  Musculoskeletal:        General: Swelling present. No tenderness or deformity.     Cervical back: Neck supple.     Right lower leg: Edema present.      Comments: Severe swelling in the right lower extremity involving the lower leg ankle and foot, increased warmth and tenderness palpation, no fluctuance  Skin:    General: Skin is warm and dry.     Findings: No rash.  Neurological:     General: No focal deficit present.     Mental Status: He is alert.     Cranial Nerves: No cranial nerve deficit (no facial droop, extraocular movements intact, no slurred speech).     Sensory: No sensory deficit.     Motor: No abnormal muscle tone or seizure activity.     Coordination: Coordination normal.  Psychiatric:        Mood and Affect: Mood normal.    ED Results / Procedures / Treatments   Labs (all labs ordered are listed, but only abnormal results are displayed) Labs Reviewed  CBC WITH DIFFERENTIAL/PLATELET - Abnormal; Notable for the following components:      Result Value   Hemoglobin 11.1 (*)    HCT 35.5 (*)    MCV 79.4 (*)    MCH 24.8 (*)    RDW 16.3 (*)    All other components within normal limits  COMPREHENSIVE METABOLIC PANEL - Abnormal; Notable for the following components:   Glucose, Bld 101 (*)    Calcium 8.0 (*)    Total Protein 9.1 (*)    Albumin 2.5 (*)    All other components within normal limits    EKG None  Radiology US Venous Img Lower Right (DVT Study)  Result Date: 11/04/2020 CLINICAL DATA:  Initial evaluation for acute leg swelling. EXAM: RIGHT LOWER EXTREMITY VENOUS DOPPLER ULTRASOUND TECHNIQUE: Gray-scale sonography with graded compression, as well as color Doppler and duplex ultrasound were performed to evaluate the lower extremity deep venous systems from the level of the common femoral vein and including the common femoral, femoral, profunda femoral, popliteal and calf veins including the posterior tibial, peroneal and gastrocnemius veins when visible. The superficial great saphenous vein was also interrogated. Spectral Doppler was utilized to evaluate flow at rest and with distal augmentation maneuvers in the  common femoral, femoral and popliteal veins. COMPARISON:  None. FINDINGS: Contralateral Common Femoral Vein: Respiratory phasicity is normal and symmetric with the symptomatic side. No evidence of thrombus. Normal compressibility. Common Femoral Vein: No evidence of thrombus. Normal compressibility, respiratory phasicity and response to augmentation. Saphenofemoral  Junction: No evidence of thrombus. Normal compressibility and flow on color Doppler imaging. Profunda Femoral Vein: No evidence of thrombus. Normal compressibility and flow on color Doppler imaging. Femoral Vein: No evidence of thrombus. Normal compressibility, respiratory phasicity and response to augmentation. Popliteal Vein: No evidence of thrombus. Normal compressibility, respiratory phasicity and response to augmentation. Calf Veins: No evidence of thrombus. Normal compressibility and flow on color Doppler imaging. Superficial Great Saphenous Vein: No evidence of thrombus. Normal compressibility. Venous Reflux:  None. Other Findings: Few scattered normal appearing lymph nodes noted within the inguinal regions bilaterally. Mild diffuse soft tissue/interstitial edema present within the soft tissues of the right calf. IMPRESSION: 1. No evidence of deep venous thrombosis. 2. Mild diffuse soft tissue/interstitial edema within the soft tissues of the right calf. Electronically Signed   By: Rise Mu M.D.   On: 11/04/2020 19:21    Procedures Procedures   Medications Ordered in ED Medications  cefTRIAXone (ROCEPHIN) 1 g in sodium chloride 0.9 % 100 mL IVPB (0 g Intravenous Stopped 11/04/20 2147)    ED Course  I have reviewed the triage vital signs and the nursing notes.  Pertinent labs & imaging results that were available during my care of the patient were reviewed by me and considered in my medical decision making (see chart for details).    MDM Rules/Calculators/A&P                           Pt present with leg swelling.  No  dvt on [place].  Exam is consistent with cellulitis.  No fever.  CBC is normal.  Doubt evolving sepsis.  Given a dose of abx.  Will DC home on antibiotics.  Recommend follow-up with PCP.  Patient will need to notify his doctor's before receiving his next infusion. Final Clinical Impression(s) / ED Diagnoses Final diagnoses:  Cellulitis of right lower extremity    Rx / DC Orders ED Discharge Orders          Ordered    amoxicillin-clavulanate (AUGMENTIN) 875-125 MG tablet  2 times daily        11/04/20 2221             Linwood Dibbles, MD 11/04/20 2226

## 2020-11-04 NOTE — ED Notes (Signed)
ED Provider at bedside. 

## 2020-11-04 NOTE — ED Triage Notes (Signed)
Right leg swelling x 2 days, states pain with touch, painful with ambulation

## 2020-11-04 NOTE — Discharge Instructions (Addendum)
Take the medications as prescribed for the cellulitis.  Try to keep your leg elevated.  Monitor for fevers, chills, worsening symptoms.  Follow up with your doctor and make them aware of the infection before you are to receive the next infusion

## 2020-11-16 ENCOUNTER — Emergency Department (HOSPITAL_BASED_OUTPATIENT_CLINIC_OR_DEPARTMENT_OTHER): Payer: Medicaid Other

## 2020-11-16 ENCOUNTER — Emergency Department (HOSPITAL_BASED_OUTPATIENT_CLINIC_OR_DEPARTMENT_OTHER)
Admission: EM | Admit: 2020-11-16 | Discharge: 2020-11-16 | Disposition: A | Discharge status: Home / Self Care | Payer: Medicaid Other | Attending: Emergency Medicine | Admitting: Emergency Medicine

## 2020-11-16 ENCOUNTER — Encounter (HOSPITAL_BASED_OUTPATIENT_CLINIC_OR_DEPARTMENT_OTHER): Payer: Self-pay | Admitting: Emergency Medicine

## 2020-11-16 ENCOUNTER — Other Ambulatory Visit: Payer: Self-pay

## 2020-11-16 DIAGNOSIS — M79661 Pain in right lower leg: Secondary | ICD-10-CM

## 2020-11-16 DIAGNOSIS — L03119 Cellulitis of unspecified part of limb: Secondary | ICD-10-CM

## 2020-11-16 DIAGNOSIS — R2241 Localized swelling, mass and lump, right lower limb: Secondary | ICD-10-CM | POA: Diagnosis present

## 2020-11-16 DIAGNOSIS — L03115 Cellulitis of right lower limb: Secondary | ICD-10-CM | POA: Diagnosis not present

## 2020-11-16 DIAGNOSIS — R509 Fever, unspecified: Secondary | ICD-10-CM | POA: Diagnosis not present

## 2020-11-16 DIAGNOSIS — M7989 Other specified soft tissue disorders: Secondary | ICD-10-CM

## 2020-11-16 LAB — CBC WITH DIFFERENTIAL/PLATELET
Abs Immature Granulocytes: 0.02 10*3/uL (ref 0.00–0.07)
Basophils Absolute: 0 10*3/uL (ref 0.0–0.1)
Basophils Relative: 0 %
Eosinophils Absolute: 0.2 10*3/uL (ref 0.0–0.5)
Eosinophils Relative: 3 %
HCT: 32.9 % — ABNORMAL LOW (ref 39.0–52.0)
Hemoglobin: 10.3 g/dL — ABNORMAL LOW (ref 13.0–17.0)
Immature Granulocytes: 0 %
Lymphocytes Relative: 14 %
Lymphs Abs: 1.1 10*3/uL (ref 0.7–4.0)
MCH: 25.2 pg — ABNORMAL LOW (ref 26.0–34.0)
MCHC: 31.3 g/dL (ref 30.0–36.0)
MCV: 80.6 fL (ref 80.0–100.0)
Monocytes Absolute: 0.7 10*3/uL (ref 0.1–1.0)
Monocytes Relative: 9 %
Neutro Abs: 5.9 10*3/uL (ref 1.7–7.7)
Neutrophils Relative %: 74 %
Platelets: 314 10*3/uL (ref 150–400)
RBC: 4.08 MIL/uL — ABNORMAL LOW (ref 4.22–5.81)
RDW: 16.2 % — ABNORMAL HIGH (ref 11.5–15.5)
WBC: 7.9 10*3/uL (ref 4.0–10.5)
nRBC: 0 % (ref 0.0–0.2)

## 2020-11-16 LAB — BASIC METABOLIC PANEL
Anion gap: 8 (ref 5–15)
BUN: 11 mg/dL (ref 6–20)
CO2: 25 mmol/L (ref 22–32)
Calcium: 8 mg/dL — ABNORMAL LOW (ref 8.9–10.3)
Chloride: 102 mmol/L (ref 98–111)
Creatinine, Ser: 0.95 mg/dL (ref 0.61–1.24)
GFR, Estimated: >60 mL/min (ref >60)
Glucose, Bld: 97 mg/dL (ref 70–99)
Potassium: 4.1 mmol/L (ref 3.5–5.1)
Sodium: 135 mmol/L (ref 135–145)

## 2020-11-16 LAB — BRAIN NATRIURETIC PEPTIDE: B Natriuretic Peptide: 79.2 pg/mL (ref 0.0–100.0)

## 2020-11-16 MED ORDER — DOXYCYCLINE HYCLATE 100 MG PO CAPS: 100.0000 mg | ORAL_CAPSULE | Freq: Two times a day (BID) | ORAL | 0 refills | Status: AC

## 2020-11-16 NOTE — ED Provider Notes (Signed)
[facility] EMERGENCY DEPARTMENT Provider Note   CSN: [id number] Arrival date & time: 11/16/20  1043     History Chief Complaint  Patient presents with   Cellulitis    Martin [name] is a 45 y.o. male.  HPI  Patient with history of hydradenitis suppurativa with monthly Avsola infusions presents with cellulitis to the right leg.  He was seen on 11/04/2020 for the same complaint, he had a negative DVT study and was started given IV ceftriaxone in the hospital and discharged on Augmentin.  He has 1-1/2 days left of Augmentin.  States she initially noticed swelling and erythema to the right leg, he had edema as well that was worse in the ankle and worse with ambulation.  He uses compression stockings, but it has not been helping.  States that despite taking the antibiotics, the skin discoloration has not improved and the swelling has stayed the same.  He also is having intermittent fevers and chills.  He has no other medical history, no high blood pressure, no history of heart failure, not on any diuretics.  He has not had any chest pain or shortness of breath.  No IV drug use, no history of blood clots, not a smoker.  Past Medical History:  Diagnosis Date   Abscess of buttock    Abscess of thigh s/p I&D in past 10/26/2013   Anemia    Blood transfusion without reported diagnosis    Chicken pox    Hydradenitis     Patient Active Problem List   Diagnosis Date Noted   Anemia of chronic disease 08/25/2014   Sepsis (HCC) 08/24/2014   Chronic anemia 08/24/2014   Cellulitis of left lower extremity    Abscess of thigh s/p I&D in past 10/26/2013   Hidradenitis suppurativa of trunk, genetalia, thighs - EXTENSIVE 10/26/2013    Past Surgical History:  Procedure Laterality Date   ABCESS DRAINAGE     Unremarkable.         Family History  Problem Relation Age of Onset   Healthy Mother        Living   Diabetes Maternal Grandmother    Hypertension Maternal Grandfather     Emphysema Maternal Uncle        x2-Deceased   Other Sister        Hydradenitis    Social History   Tobacco Use   Smoking status: Never   Smokeless tobacco: Never  Substance Use Topics   Alcohol use: Yes    Alcohol/week: 8.0 standard drinks    Types: 4 Glasses of wine, 4 Shots of liquor per week    Comment: weekly   Drug use: No    Home Medications Prior to Admission medications   Medication Sig Start Date End Date Taking? Authorizing Provider  Adalimumab (HUMIRA PEN [place]) Inject into the skin.    [provider]  amoxicillin-clavulanate (AUGMENTIN) 875-125 MG tablet Take 1 tablet by mouth 2 (two) times daily. 11/04/20   Linwood Dibbles, MD  Emollient [facility] ADVANCED CARE EX) Apply 1 application topically daily as needed (for eczema).    [provider]  ferrous sulfate 325 (65 FE) MG EC tablet Take 1 tablet (325 mg total) by mouth daily with breakfast. 09/06/19   Claiborne Rigg, NP    Allergies    Patient has no known allergies.  Review of Systems   Review of Systems  Constitutional:  Positive for chills and fever.  Respiratory:  Negative for shortness of breath.  Cardiovascular:  Positive for leg swelling. Negative for chest pain.  Gastrointestinal:  Negative for nausea and vomiting.  Skin:  Positive for color change.  Neurological:  Negative for dizziness.   Physical Exam Updated Vital Signs BP 127/81 (BP Location: Right Arm)   Pulse 97   Temp 98.7 F (37.1 C) (Oral)   Resp 16   SpO2 98%   Physical Exam Vitals and nursing note reviewed. Exam conducted with a chaperone present.  Constitutional:      General: He is not in acute distress.    Appearance: Normal appearance.  HENT:     Head: Normocephalic and atraumatic.  Eyes:     General: No scleral icterus.    Extraocular Movements: Extraocular movements intact.     Pupils: Pupils are equal, round, and reactive to light.  Cardiovascular:     Rate and Rhythm: Normal rate and regular rhythm.      Pulses: Normal pulses.     Heart sounds: Normal heart sounds.     Comments: DP and PT are palpable, but it is difficult due to the amount of edema. Pulmonary:     Effort: Pulmonary effort is normal.     Breath sounds: Normal breath sounds.  Musculoskeletal:        General: Swelling present.     Comments: Patient with pitting edema to right leg all the way up to the posterior thigh, there is no tenderness to calf palpation.  Unable to compare to how it was 1 week ago, please see picture below for comparison.  Skin:    Coloration: Skin is not jaundiced.     Findings: Rash present.     Comments: Please see attached photo  Neurological:     Mental Status: He is alert. Mental status is at baseline.     Coordination: Coordination normal.     ED Results / Procedures / Treatments   Labs (all labs ordered are listed, but only abnormal results are displayed) Labs Reviewed - No data to display  EKG None  Radiology No results found.  Procedures Procedures   Medications Ordered in ED Medications - No data to display  ED Course  I have reviewed the triage vital signs and the nursing notes.  Pertinent labs & imaging results that were available during my care of the patient were reviewed by me and considered in my medical decision making (see chart for details).  Clinical Course as of 11/16/20 1443  Sat Nov 16, 2020  1249 US Venous Img Lower Unilateral Right (DVT) No evidence of a DVT [HS]  1250 Basic metabolic panel(!) No electrolyte derangement, no AKI, no anion gap [HS]  1252 CBC with Differential(!) No leukocytosis, patient is anemic but stable and at baseline. [HS]    Clinical Course User Index [HS] Theron Arista, PA-C   MDM Rules/Calculators/A&P                           Patient vitals are stable, work-up is reassuring.  Do not suspect DVT based on [place] study. Patient is not septic.  Do not see indications that he needs hospitalization for IV antibiotics.  It is unclear to  me whether this is cellulitis, but given his immunocompromised state I will switch him to doxycycline.  He has follow-up appointment with his dermatologist on Monday, I will advise him to follow-up with them for reevaluation.  Ultrasound also shows signs that could be consistent with lymphedema.  He does  have pitting edema on exam that extends almost to his posterior thigh. It is unclear how long this been ongoing but given he has a history of using compression stockings I think it has been an issue longer than just the 2 weeks he has been seen in the ER for cellulitis. Does not appear related to CHF, and there were no signs of pulmonary edema or cardiomegaly on CXR. Patient is stable and appropriate for outpatient management. Discussed return precautions and patient is agreeable. Stable for discharge at this time.   Discussed HPI, physical exam and plan of care for this patient with attending C Bernette Mayers. The attending physician evaluated this patient as part of a shared visit and agrees with plan of care.    Final Clinical Impression(s) / ED Diagnoses Final diagnoses:  None    Rx / DC Orders ED Discharge Orders     None        Theron Arista, [place] 11/16/20 1446    Pollyann Savoy, MD 11/16/20 [id number]

## 2020-11-16 NOTE — Discharge Instructions (Addendum)
Discontinue the Augmentin and start taking doxycycline.  Take this twice daily for the next 10 days.  It is unclear to me whether the changes in your skin are due to cellulitis or possibly another etiology.  It is possible you also have lymphedema based on the significant swelling in your leg.  Please use compression stockings and continue to keep it elevated when possible.  When you follow-up with your dermatologist on Monday, have them reevaluate your leg and asked them for follow-up evaluation if the cellulitis does not start to improve.  If you develop shortness of breath, chest pain, fevers, chills please return back to the ED for further evaluation.

## 2020-11-16 NOTE — ED Triage Notes (Signed)
Pt reports being Dx with cellulitis 2 weeks ago. Pt stating he finished his Antibiotics but his leg is still red and swollen. Denies fevers.

## 2020-11-16 NOTE — ED Notes (Signed)
Pt ambulatory to waiting room. Pt verbalized understanding of discharge instructions.   

## 2020-11-25 ENCOUNTER — Other Ambulatory Visit: Payer: Self-pay

## 2020-11-25 ENCOUNTER — Ambulatory Visit: Payer: Medicaid Other | Attending: Nurse Practitioner | Admitting: Nurse Practitioner

## 2020-11-25 ENCOUNTER — Encounter: Payer: Self-pay | Admitting: Nurse Practitioner

## 2020-11-25 DIAGNOSIS — Z79899 Other long term (current) drug therapy: Secondary | ICD-10-CM | POA: Insufficient documentation

## 2020-11-25 DIAGNOSIS — I890 Lymphedema, not elsewhere classified: Secondary | ICD-10-CM | POA: Insufficient documentation

## 2020-11-25 MED ORDER — FUROSEMIDE 20 MG PO TABS: 20.0000 mg | ORAL_TABLET | Freq: Every day | ORAL | 0 refills | Status: DC

## 2020-11-25 NOTE — Progress Notes (Signed)
Virtual Visit Note Due to national recommendations of social distancing due to COVID 19, virtual visit is felt to be most appropriate for this patient at this time.  I discussed the limitations, risks, security and privacy concerns of performing an evaluation and management service by video and the availability of in person appointments. I also discussed with the patient that there may be a patient responsible charge related to this service. The patient expressed understanding and agreed to proceed.    I connected with Martin [name] on 11/25/20  at   9:30 AM EDT  EDT by VIDEO and verified that I am speaking with the correct person using two identifiers.   Location of Patient: Private Residence   Location of Provider: [facility]    Persons participating in VIRTUAL visit: Bertram Denver FNP-BC LAURO MANLOVE    History of Present Illness: VIRTUAL visit for: Right leg edema  Edema: Patient complains of edema. The location of the edema is lower leg(s) left, ankle(s) left, feet left.  The edema has been moderate.  Onset of symptoms was several weeks ago, gradually improving since that time. The edema is present all day.   The swelling has been aggravated by nothing, relieved by support stockings, elevation of involved area. Cardiac risk factors include male gender and sedentary lifestyle.  He was treated for RLE cellulitis on 2 separate ED visits with augmentin and doxycycline. Most recently 11-16-2020. On the second visit it was noted that he could possibly have lymphedema. Left was noted for edema, erythema, peeling skin. DVT negative.     Past Medical History:  Diagnosis Date   Abscess of buttock    Abscess of thigh s/p I&D in past 10/26/2013   Anemia    Blood transfusion without reported diagnosis    Chicken pox    Hydradenitis     Past Surgical History:  Procedure Laterality Date   ABCESS DRAINAGE     Unremarkable.      Family History   Problem Relation Age of Onset   Healthy Mother        Living   Diabetes Maternal Grandmother    Hypertension Maternal Grandfather    Emphysema Maternal Uncle        x2-Deceased   Other Sister        Hydradenitis    Social History   Socioeconomic History   Marital status: Single    Spouse name: Not on file   Number of children: Not on file   Years of education: Not on file   Highest education level: Not on file  Occupational History   Not on file  Tobacco Use   Smoking status: Never   Smokeless tobacco: Never  Substance and Sexual Activity   Alcohol use: Yes    Alcohol/week: 8.0 standard drinks    Types: 4 Glasses of wine, 4 Shots of liquor per week    Comment: weekly   Drug use: No   Sexual activity: Never    Comment: sexually active with men previously  Other Topics Concern   Not on file  Social History Narrative   Not on file   Social Determinants of Health   Financial Resource Strain: Not on file  Food Insecurity: Not on file  Transportation Needs: Not on file  Physical Activity: Not on file  Stress: Not on file  Social Connections: Not on file     Observations/Objective: Awake, alert and oriented x 3   Review of Systems  Constitutional:  Negative for fever, malaise/fatigue and weight loss.  HENT: Negative.  Negative for nosebleeds.   Eyes: Negative.  Negative for blurred vision, double vision and photophobia.  Respiratory: Negative.  Negative for cough and shortness of breath.   Cardiovascular:  Positive for leg swelling. Negative for chest pain and palpitations.  Gastrointestinal: Negative.  Negative for heartburn, nausea and vomiting.  Musculoskeletal: Negative.  Negative for myalgias.  Neurological: Negative.  Negative for dizziness, focal weakness, seizures and headaches.  Psychiatric/Behavioral: Negative.  Negative for suicidal ideas.    Assessment and Plan: Diagnoses and all orders for this visit:  Lymphedema -     furosemide (LASIX) 20 MG  tablet; Take 1 tablet (20 mg total) by mouth daily for 5 days. -     Ambulatory referral to Hematology / Oncology Continue compression stockings DASH DIET Elevate lower extremities as often as possible    Follow Up Instructions Return in about 1 week (around 12/02/2020) for lymphedema.     I discussed the assessment and treatment plan with the patient. The patient was provided an opportunity to ask questions and all were answered. The patient agreed with the plan and demonstrated an understanding of the instructions.   The patient was advised to call back or seek an in-person evaluation if the symptoms worsen or if the condition fails to improve as anticipated.  I provided 14 minutes of face-to-face time during this encounter including median intraservice time, reviewing previous notes, labs, imaging, medications and explaining diagnosis and management.  Claiborne Rigg, FNP-BC

## 2020-11-29 ENCOUNTER — Other Ambulatory Visit: Payer: Self-pay | Admitting: Nurse Practitioner

## 2020-12-03 ENCOUNTER — Encounter: Payer: Self-pay | Admitting: Nurse Practitioner

## 2020-12-03 ENCOUNTER — Other Ambulatory Visit: Payer: Self-pay

## 2020-12-03 ENCOUNTER — Ambulatory Visit: Payer: Medicaid Other | Attending: Nurse Practitioner | Admitting: Nurse Practitioner

## 2020-12-03 VITALS — BP 122/77 | HR 72 | Ht 73.0 in | Wt 229.0 lb

## 2020-12-03 DIAGNOSIS — Z79899 Other long term (current) drug therapy: Secondary | ICD-10-CM | POA: Insufficient documentation

## 2020-12-03 DIAGNOSIS — M7989 Other specified soft tissue disorders: Secondary | ICD-10-CM | POA: Diagnosis not present

## 2020-12-03 DIAGNOSIS — R2241 Localized swelling, mass and lump, right lower limb: Secondary | ICD-10-CM

## 2020-12-03 DIAGNOSIS — R600 Localized edema: Secondary | ICD-10-CM | POA: Insufficient documentation

## 2020-12-03 NOTE — Progress Notes (Signed)
Swelling in right leg.

## 2020-12-03 NOTE — Progress Notes (Signed)
Assessment & Plan:  Martin [name] was seen today for edema.  Diagnoses and all orders for this visit:  Localized swelling of right lower leg -     MR TIBIA FIBULA RIGHT W WO CONTRAST; Future   Patient has been counseled on age-appropriate routine health concerns for screening and prevention. These are reviewed and up-to-date. Referrals have been placed accordingly. Immunizations are up-to-date or declined.    Subjective:   Chief Complaint  Patient presents with   Edema   HPI Martin [name] 45 y.o. male presents to office today for follow up to RLE swelling.  The location of the edema extends from below the knee down to the right ankle and foot. The edema has been moderate.  Onset of symptoms was several weeks ago, gradually improving since that time. The edema is present all day.  The swelling has been aggravated by nothing, relieved by support stockings, elevation of involved area. Cardiac risk factors include male gender and sedentary lifestyle.  He was treated for RLE cellulitis on 2 separate ED visits with augmentin and doxycycline. Most recently 11-16-2020. On the second visit it was noted that he could possibly have lymphedema. Left was noted for edema, erythema, peeling skin. DVT negative. Returns today after a day 7 regimen of lasix 20mg  with little improvement.       Review of Systems  Constitutional:  Negative for fever, malaise/fatigue and weight loss.  HENT: Negative.  Negative for nosebleeds.   Eyes: Negative.  Negative for blurred vision, double vision and photophobia.  Respiratory: Negative.  Negative for cough, shortness of breath and wheezing.   Cardiovascular:  Positive for leg swelling. Negative for chest pain, palpitations and PND.  Gastrointestinal: Negative.  Negative for heartburn, nausea and vomiting.  Musculoskeletal: Negative.  Negative for myalgias.  Neurological: Negative.  Negative for dizziness, focal weakness, seizures and headaches.   Psychiatric/Behavioral: Negative.  Negative for suicidal ideas.    Past Medical History:  Diagnosis Date   Abscess of buttock    Abscess of thigh s/p I&D in past 10/26/2013   Anemia    Blood transfusion without reported diagnosis    Chicken pox    Hydradenitis     Past Surgical History:  Procedure Laterality Date   ABCESS DRAINAGE     Unremarkable.      Family History  Problem Relation Age of Onset   Healthy Mother        Living   Diabetes Maternal Grandmother    Hypertension Maternal Grandfather    Emphysema Maternal Uncle        x2-Deceased   Other Sister        Hydradenitis    Social History Reviewed with no changes to be made today.   Outpatient Medications Prior to Visit  Medication Sig Dispense Refill   Adalimumab (HUMIRA PEN [place]) Inject into the skin. (Patient not taking: Reported on 12/03/2020)     doxycycline (VIBRAMYCIN) 100 MG capsule Take 1 capsule (100 mg total) by mouth 2 (two) times daily. (Patient not taking: Reported on 12/03/2020) 20 capsule 0   Emollient (AVEENO ADVANCED CARE EX) Apply 1 application topically daily as needed (for eczema). (Patient not taking: Reported on 12/03/2020)     ferrous sulfate 325 (65 FE) MG EC tablet Take 1 tablet (325 mg total) by mouth daily with breakfast. (Patient not taking: Reported on 12/03/2020) 30 tablet 1   furosemide (LASIX) 20 MG tablet Take 1 tablet (20 mg total) by mouth daily for 5 days.  5 tablet 0   No facility-administered medications prior to visit.    No Known Allergies     Objective:    BP 122/77   Pulse 72   Ht 6\' 1"  (1.854 m)   Wt 229 lb (103.9 kg)   SpO2 100%   BMI 30.21 kg/m  Wt Readings from Last 3 Encounters:  12/03/20 229 lb (103.9 kg)  11/04/20 220 lb (99.8 kg)  09/06/19 226 lb (102.5 kg)    Physical Exam Vitals and nursing note reviewed.  Constitutional:      Appearance: He is well-developed.  HENT:     Head: Normocephalic and atraumatic.  Cardiovascular:     Rate and Rhythm:  Normal rate and regular rhythm.     Heart sounds: Normal heart sounds. No murmur heard.   No friction rub. No gallop.  Pulmonary:     Effort: Pulmonary effort is normal. No tachypnea or respiratory distress.     Breath sounds: Normal breath sounds. No decreased breath sounds, wheezing, rhonchi or rales.  Chest:     Chest wall: No tenderness.  Abdominal:     General: Bowel sounds are normal.     Palpations: Abdomen is soft.  Musculoskeletal:        General: Normal range of motion.     Cervical back: Normal range of motion.     Right upper leg: Normal.     Left upper leg: Normal.     Right knee: Normal.     Left knee: Normal.     Right lower leg: Normal. No swelling. No edema.     Left lower leg: No deformity, lacerations, tenderness or bony tenderness. Edema present.     Right ankle: Normal.     Right Achilles Tendon: Normal.     Left ankle: Swelling present. No ecchymosis. No tenderness.     Left Achilles Tendon: No tenderness.     Right foot: Normal.     Left foot: Swelling present. No deformity, tenderness or bony tenderness.  Feet:     Left foot:     Skin integrity: Warmth present. No ulcer, blister, skin breakdown or erythema.     Toenail Condition: Left toenails are abnormally thick. Fungal disease present. Skin:    General: Skin is warm and dry.  Neurological:     Mental Status: He is alert and oriented to person, place, and time.     Coordination: Coordination normal.  Psychiatric:        Behavior: Behavior normal. Behavior is cooperative.        Thought Content: Thought content normal.        Judgment: Judgment normal.         Patient has been counseled extensively about nutrition and exercise as well as the importance of adherence with medications and regular follow-up. The patient was given clear instructions to go to ER or return to medical center if symptoms don't improve, worsen or new problems develop. The patient verbalized understanding.   Follow-up:  Return if symptoms worsen or fail to improve.   09/08/19, FNP-BC [facility] [place], [place]   12/03/2020, 10:34 AM

## 2020-12-05 ENCOUNTER — Telehealth: Payer: Self-pay | Admitting: Nurse Practitioner

## 2020-12-05 NOTE — Telephone Encounter (Signed)
As of 12/05/20 PA has been submitted and clinical information has been faxed to insurance company.

## 2020-12-05 NOTE — Telephone Encounter (Signed)
Copied from CRM [id number]. Topic: General - Other >> Dec 05, 2020  2:44 PM Laural Benes, [place] C wrote: Reason for CRM: received a call from pre-cert with [facility]. Pt is scheduled to have a MRI completed. They are calling to request to have this pre-certed by pt's insurance.    Phone if needed: [phone] ext [phone]

## 2020-12-11 ENCOUNTER — Ambulatory Visit (HOSPITAL_COMMUNITY): Payer: Medicaid Other

## 2020-12-18 ENCOUNTER — Ambulatory Visit (HOSPITAL_COMMUNITY): Payer: Medicaid Other

## 2021-02-27 ENCOUNTER — Encounter (HOSPITAL_BASED_OUTPATIENT_CLINIC_OR_DEPARTMENT_OTHER): Payer: Self-pay | Admitting: *Deleted

## 2021-02-27 ENCOUNTER — Emergency Department (HOSPITAL_BASED_OUTPATIENT_CLINIC_OR_DEPARTMENT_OTHER)
Admission: EM | Admit: 2021-02-27 | Discharge: 2021-02-27 | Disposition: A | Discharge status: Home / Self Care | Payer: Medicaid Other | Attending: Emergency Medicine | Admitting: Emergency Medicine

## 2021-02-27 ENCOUNTER — Other Ambulatory Visit: Payer: Self-pay

## 2021-02-27 DIAGNOSIS — Z28310 Unvaccinated for covid-19: Secondary | ICD-10-CM | POA: Insufficient documentation

## 2021-02-27 DIAGNOSIS — U071 COVID-19: Secondary | ICD-10-CM | POA: Diagnosis not present

## 2021-02-27 DIAGNOSIS — R0981 Nasal congestion: Secondary | ICD-10-CM | POA: Diagnosis present

## 2021-02-27 LAB — RESP PANEL BY RT-PCR (FLU A&B, COVID) ARPGX2
Influenza A by PCR: NEGATIVE
Influenza B by PCR: NEGATIVE
SARS Coronavirus 2 by RT PCR: POSITIVE — AB

## 2021-02-27 NOTE — ED Provider Notes (Signed)
[facility] EMERGENCY DEPARTMENT Provider Note   CSN: [id number] Arrival date & time: 02/27/21  1941     History Chief Complaint  Patient presents with   URI    Martin [name] is a 45 y.o. male.  HPI 46 year old male with a history of abscess, chronic anemia, hydradenitis suppurativa presents to the ER with complains of nasal congestion, cough, "feeling bad" which started on Tuesday.  Patient notes improvement in his symptoms today but was told to come be evaluated by his mother.  He is not vaccinated for COVID.  He also endorses a nonproductive cough, sore throat and body aches.  He has not been taking anything for his symptoms.    Past Medical History:  Diagnosis Date   Abscess of buttock    Abscess of thigh s/p I&D in past 10/26/2013   Anemia    Blood transfusion without reported diagnosis    Chicken pox    Hydradenitis     Patient Active Problem List   Diagnosis Date Noted   Anemia of chronic disease 08/25/2014   Sepsis (HCC) 08/24/2014   Chronic anemia 08/24/2014   Cellulitis of left lower extremity    Abscess of thigh s/p I&D in past 10/26/2013   Hidradenitis suppurativa of trunk, genetalia, thighs - EXTENSIVE 10/26/2013    Past Surgical History:  Procedure Laterality Date   ABCESS DRAINAGE     Unremarkable.         Family History  Problem Relation Age of Onset   Healthy Mother        Living   Diabetes Maternal Grandmother    Hypertension Maternal Grandfather    Emphysema Maternal Uncle        x2-Deceased   Other Sister        Hydradenitis    Social History   Tobacco Use   Smoking status: Never   Smokeless tobacco: Never  Substance Use Topics   Alcohol use: Yes    Alcohol/week: 8.0 standard drinks    Types: 4 Glasses of wine, 4 Shots of liquor per week    Comment: weekly   Drug use: No    Home Medications Prior to Admission medications   Medication Sig Start Date End Date Taking? Authorizing Provider  Adalimumab (HUMIRA  PEN [place]) Inject into the skin. Patient not taking: Reported on 12/03/2020    [provider]  doxycycline (VIBRAMYCIN) 100 MG capsule Take 1 capsule (100 mg total) by mouth 2 (two) times daily. Patient not taking: Reported on 12/03/2020 11/16/20   Theron Arista, PA-C  Emollient [facility] ADVANCED CARE EX) Apply 1 application topically daily as needed (for eczema). Patient not taking: Reported on 12/03/2020    [provider]  ferrous sulfate 325 (65 FE) MG EC tablet Take 1 tablet (325 mg total) by mouth daily with breakfast. Patient not taking: Reported on 12/03/2020 09/06/19   Claiborne Rigg, NP  furosemide (LASIX) 20 MG tablet Take 1 tablet (20 mg total) by mouth daily for 5 days. 11/25/20 11/30/20  Claiborne Rigg, NP    Allergies    Patient has no known allergies.  Review of Systems   Review of Systems Ten systems reviewed and are negative for acute change, except as noted in the HPI.   Physical Exam Updated Vital Signs BP 107/77   Pulse (!) 103   Temp 98.8 F (37.1 C) (Oral)   Resp 16   Ht 6\' 1"  (1.854 m)   Wt 100.2 kg   SpO2  98%   BMI 29.16 kg/m   Physical Exam Vitals and nursing note reviewed.  Constitutional:      General: He is not in acute distress.    Appearance: He is well-developed. He is not ill-appearing or diaphoretic.  HENT:     Head: Normocephalic and atraumatic.     Mouth/Throat:     Mouth: Mucous membranes are moist.     Comments: Oropharynx non erythematous without exudates, uvula midline, no unilateral tonsillar swelling, tongue normal size and midline, no sublingual/submandibular swellimg, tolerating secretions well     Eyes:     Conjunctiva/sclera: Conjunctivae normal.  Cardiovascular:     Rate and Rhythm: Normal rate and regular rhythm.     Pulses: Normal pulses.     Heart sounds: No murmur heard. Pulmonary:     Effort: Pulmonary effort is normal. No respiratory distress.     Breath sounds: Normal breath sounds.  Abdominal:      Palpations: Abdomen is soft.     Tenderness: There is no abdominal tenderness.  Musculoskeletal:        General: No swelling.     Cervical back: Neck supple.  Skin:    General: Skin is warm and dry.     Capillary Refill: Capillary refill takes less than 2 seconds.  Neurological:     General: No focal deficit present.     Mental Status: He is alert and oriented to person, place, and time.  Psychiatric:        Mood and Affect: Mood normal.    ED Results / Procedures / Treatments   Labs (all labs ordered are listed, but only abnormal results are displayed) Labs Reviewed  RESP PANEL BY RT-PCR (FLU A&B, COVID) ARPGX2 - Abnormal; Notable for the following components:      Result Value   SARS Coronavirus 2 by RT PCR POSITIVE (*)    All other components within normal limits    EKG None  Radiology No results found.  Procedures Procedures   Medications Ordered in ED Medications - No data to display  ED Course  I have reviewed the triage vital signs and the nursing notes.  Pertinent labs & imaging results that were available during my care of the patient were reviewed by me and considered in my medical decision making (see chart for details).    MDM Rules/Calculators/A&P                           Patients symptoms are consistent with URI, likely viral etiology. COVID-19 test +.  No known exposure. Patient is not vaccinated against COVID-19. Patient is afebrile, tolerating secretions.  Lungs clear to auscultation bilaterally. Normal O2 saturation. Pt will be discharged with symptomatic treatment, home isolation precautions. No comorbidity to indicate antiretroviral therapy.  Return precautions discussed. Verbalizes understanding and is agreeable with plan. Pt is hemodynamically stable & in NAD prior to dc.  Final Clinical Impression(s) / ED Diagnoses Final diagnoses:  COVID-19    Rx / DC Orders ED Discharge Orders     None        Mare Ferrari, PA-C 02/27/21  1851    Gwyneth Sprout, MD 02/27/21 2229

## 2021-02-27 NOTE — ED Triage Notes (Signed)
C/o URI sx x 3 days , cough fever, chills , sore throat , body aches

## 2021-02-27 NOTE — Discharge Instructions (Addendum)
Your COVID-19 test was positive today.  You may take ibuprofen/Tylenol for body aches and fevers, drink plenty of fluids, over-the-counter cold and flu medications. You may also buy an over-the-counter pulse oximeter which can be found at your local pharmacy. If your oxygen saturation drops below 90%, please make sure to return to the ER. Return to the ER for any worsening shortness of breath.

## 2023-05-15 ENCOUNTER — Telehealth: Payer: Medicaid Other

## 2023-12-16 ENCOUNTER — Telehealth: Admitting: Physician Assistant

## 2023-12-16 DIAGNOSIS — I872 Venous insufficiency (chronic) (peripheral): Secondary | ICD-10-CM | POA: Diagnosis not present

## 2023-12-16 MED ORDER — FUROSEMIDE 20 MG PO TABS: 20.0000 mg | ORAL_TABLET | Freq: Every day | ORAL | 0 refills | Status: DC

## 2023-12-16 NOTE — Progress Notes (Signed)
 Virtual Visit Consent   Martin [name], you are scheduled for a virtual visit with a [facility] provider today. Just as with appointments in the office, your consent must be obtained to participate. Your consent will be active for this visit and any virtual visit you may have with one of our providers in the next 365 days. If you have a MyChart account, a copy of this consent can be sent to you electronically.  As this is a virtual visit, video technology does not allow for your provider to perform a traditional examination. This may limit your provider's ability to fully assess your condition. If your provider identifies any concerns that need to be evaluated in person or the need to arrange testing (such as labs, EKG, etc.), we will make arrangements to do so. Although advances in technology are sophisticated, we cannot ensure that it will always work on either your end or our end. If the connection with a video visit is poor, the visit may have to be switched to a telephone visit. With either a video or telephone visit, we are not always able to ensure that we have a secure connection.  By engaging in this virtual visit, you consent to the provision of healthcare and authorize for your insurance to be billed (if applicable) for the services provided during this visit. Depending on your insurance coverage, you may receive a charge related to this service.  I need to obtain your verbal consent now. Are you willing to proceed with your visit today? MIZRAIM HARMENING has provided verbal consent on 12/16/2023 for a virtual visit (video or telephone). Delon [name], PA-C  Date: 12/16/2023 8:44 AM   Virtual Visit via Video Note   I, Delon [name], connected with  Martin [name]  ([id number], 03-Dec-1975) on 12/16/23 at  8:00 AM EDT by a video-enabled telemedicine application and verified that I am speaking with the correct person using two identifiers.  Location: Patient: Virtual Visit  Location Patient: Home Provider: Virtual Visit Location Provider: Home Office   I discussed the limitations of evaluation and management by telemedicine and the availability of in person appointments. The patient expressed understanding and agreed to proceed.    History of Present Illness: Martin [name] is a 48 y.o. who identifies as a male who was assigned male at birth, and is being seen today for swelling in his right lower leg and foot. This is a chronic issue for which he was evaluated for in the ER a while back with an Ultrasound. Since then he reports the swelling has continued and is intermittent. He will wear a compression stocking occasionally when it gets bad. Of recent he has noticed it is swelling more and not going all the way back to normal like it used to. He feels this may be from his job where he does stand for long periods of time. He denies any discoloration or signs of infection. He is not having much pain, it is just the swelling that concerns him.   Problems:  Patient Active Problem List   Diagnosis Date Noted   Anemia of chronic disease 08/25/2014   Sepsis (HCC) 08/24/2014   Chronic anemia 08/24/2014   Cellulitis of left lower extremity    Abscess of thigh s/p I&D in past 10/26/2013   Hidradenitis suppurativa of trunk, genetalia, thighs - EXTENSIVE 10/26/2013    Allergies: No Known Allergies Medications:  Current Outpatient Medications:    furosemide  (LASIX ) 20 MG tablet, Take  1 tablet (20 mg total) by mouth daily., Disp: 10 tablet, Rfl: 0   Adalimumab (HUMIRA PEN [place]), Inject into the skin. (Patient not taking: Reported on 12/03/2020), Disp: , Rfl:    doxycycline  (VIBRAMYCIN ) 100 MG capsule, Take 1 capsule (100 mg total) by mouth 2 (two) times daily. (Patient not taking: Reported on 12/03/2020), Disp: 20 capsule, Rfl: 0   Emollient (AVEENO ADVANCED CARE EX), Apply 1 application topically daily as needed (for eczema). (Patient not taking: Reported on 12/03/2020),  Disp: , Rfl:    ferrous sulfate  325 (65 FE) MG EC tablet, Take 1 tablet (325 mg total) by mouth daily with breakfast. (Patient not taking: Reported on 12/03/2020), Disp: 30 tablet, Rfl: 1  Observations/Objective: Patient is well-developed, well-nourished in no acute distress.  Resting comfortably at home.  Head is normocephalic, atraumatic.  No labored breathing.  Speech is clear and coherent with logical content.  Patient is alert and oriented at baseline.    Assessment and Plan: 1. Venous insufficiency of right leg (Primary) - furosemide  (LASIX ) 20 MG tablet; Take 1 tablet (20 mg total) by mouth daily.  Dispense: 10 tablet; Refill: 0  - Suspect chronic venous insufficiency - Discussed wearing the compression stocking while at work  - Discussed he could get quality compression stockings from [employer]  - Would recommend to use or higher compression - Elevate leg as able - May use Tylenol  or Ibuprofen if aching or painful - Furosemide  short term as above - Follow up with PCP in near future - Consider referral to vein specialist - Seek in person evaluation urgently if acutely worsens   Follow Up Instructions: I discussed the assessment and treatment plan with the patient. The patient was provided an opportunity to ask questions and all were answered. The patient agreed with the plan and demonstrated an understanding of the instructions.  A copy of instructions were sent to the patient via MyChart unless otherwise noted below.    The patient was advised to call back or seek an in-person evaluation if the symptoms worsen or if the condition fails to improve as anticipated.    Delon [name], PA-C

## 2023-12-16 NOTE — Patient Instructions (Signed)
 Martin [name], thank you for joining Martin [name], PA-C for today's virtual visit.  While this provider is not your primary care provider (PCP), if your PCP is located in our provider database this encounter information will be shared with them immediately following your visit.   A [facility] MyChart account gives you access to today's visit and all your visits, tests, and labs performed at [facility]  click here if you don't have a [facility] MyChart account or go to [email or web address]  Consent: (Patient) Martin [name] provided verbal consent for this virtual visit at the beginning of the encounter.  Current Medications:  Current Outpatient Medications:    furosemide  (LASIX ) 20 MG tablet, Take 1 tablet (20 mg total) by mouth daily., Disp: 10 tablet, Rfl: 0   Adalimumab (HUMIRA PEN [facility]), Inject into the skin. (Patient not taking: Reported on 12/03/2020), Disp: , Rfl:    doxycycline  (VIBRAMYCIN ) 100 MG capsule, Take 1 capsule (100 mg total) by mouth 2 (two) times daily. (Patient not taking: Reported on 12/03/2020), Disp: 20 capsule, Rfl: 0   Emollient ([facility] ADVANCED CARE EX), Apply 1 application topically daily as needed (for eczema). (Patient not taking: Reported on 12/03/2020), Disp: , Rfl:    ferrous sulfate  325 (65 FE) MG EC tablet, Take 1 tablet (325 mg total) by mouth daily with breakfast. (Patient not taking: Reported on 12/03/2020), Disp: 30 tablet, Rfl: 1   Medications ordered in this encounter:  Meds ordered this encounter  Medications   furosemide  (LASIX ) 20 MG tablet    Sig: Take 1 tablet (20 mg total) by mouth daily.    Dispense:  10 tablet    Refill:  0    Supervising Provider:   BLAISE ALEENE KIDD [[id number]]     *If you need refills on other medications prior to your next appointment, please contact your pharmacy*  Follow-Up: Call back or seek an in-person evaluation if the symptoms worsen or if the condition fails to improve as  anticipated.  [facility] [phone]  Other Instructions Chronic Venous Insufficiency Chronic venous insufficiency is a condition that causes the veins in the legs to struggle to pump blood from the legs to the heart. It is also called venous stasis. This condition can happen when the vein walls are stretched, weakened, or damaged. It can also happen when the valves inside the vein are damaged. With the right treatment, you should be able to still lead an active life. What are the causes? Common causes of this condition include: Venous hypertension. This is high blood pressure inside the veins. Sitting or standing too long. This can cause increased blood pressure in the veins of the leg. Deep vein thrombosis (DVT). This is a blood clot that blocks blood flow in a vein. Phlebitis. This is inflammation of a vein. It can cause a blood clot to form. An abnormal growth of cells (tumor) in the area between your hip bones (pelvis). This can cause blood to back up. What increases the risk? Factors that may make you more likely to get this condition include: Having a family history of the condition. Being overweight. Being pregnant. Not getting enough exercise. Smoking. Having a job that requires you to sit or stand in one place for a long time. Being a certain age. Females in their 12s and 73s and males in their 57s are more likely to get this condition. What are the signs or symptoms? Symptoms of this condition include: Varicose veins. These  are veins that are enlarged, bulging, or twisted. Skin breakdown or ulcers. Reddened skin or dark discoloration of the skin on the leg between the knee and ankle. Lipodermatosclerosis. This is brown, smooth, tight, and painful skin just above the ankle. It is often on the inside of the leg. Swelling of the legs. How is this diagnosed? This condition may be diagnosed based on your medical history and a physical exam. You may also need  tests, such as: A duplex ultrasound. This shows how blood flows through a blood vessel. Plethysmography. This tests blood flow. Venogram. This looks at the veins using an X-ray and dye. How is this treated? The goals of treatment are to help you return to an active life and to relieve pain. Treatment may include: Wearing compression stockings. These do not cure the condition but can help relieve symptoms. They can also help stop your condition from getting worse. Sclerotherapy. This involves injecting a solution to shrink damaged veins. Surgery. This may include: Vein stripping. This is when a diseased vein is taken out. Laser ablation surgery. This is when blood flow is cut off through the vein. Repairing or remaking a valve inside the affected vein. Follow these instructions at home: Lifestyle Do not use any products that contain nicotine or tobacco. These products include cigarettes, chewing tobacco, and vaping devices, such as e-cigarettes. If you need help quitting, ask your health care provider. Stay active. Exercise, walk, or do other activities. Ask your provider what activities are safe for you. General instructions Take over-the-counter and prescription medicines only as told by your provider. Drink enough fluid to keep your pee (urine) pale yellow. Wear compression stockings as told by your provider. These stockings help to prevent blood clots and reduce swelling in your legs. Keep all follow-up visits. Your provider will check your legs for any changes and adjust your treatment plan as needed. Contact a health care provider if: You have redness, swelling, or more pain in the affected area. You see a red streak or line that goes up or down from the area. You have skin breakdown or skin loss. You get an injury in the affected area. You get a fever. Get help right away if: You have severe pain that does not get better with medicine. You get an injury and an open wound in the  affected area. Your foot or ankle becomes numb or weak all of a sudden. You have trouble moving your foot or ankle. Your symptoms do not go away or get worse. You have chest pain. You have shortness of breath. These symptoms may be an emergency. Get help right away. Call 911. Do not wait to see if the symptoms will go away. Do not drive yourself to the hospital. This information is not intended to replace advice given to you by your health care provider. Make sure you discuss any questions you have with your health care provider. Document Revised: 04/14/2022 Document Reviewed: 04/14/2022 Elsevier Patient Education  2024 [employer]   If you have been instructed to have an in-person evaluation today at a local Urgent Care facility, please use the link below. It will take you to a list of all of our available [facility], including address, phone number and hours of operation. Please do not delay care.  [facility]  If you or a family member do not have a primary care provider, use the link below to schedule a visit and establish care. When you choose a  [facility] primary care physician or advanced practice provider, you gain a long-term partner in health. Find a Primary Care Provider  Learn more about [facility] in-office and virtual care options: [facility] - Get Care Now

## 2024-04-10 ENCOUNTER — Other Ambulatory Visit: Payer: Self-pay

## 2024-04-10 ENCOUNTER — Emergency Department (HOSPITAL_BASED_OUTPATIENT_CLINIC_OR_DEPARTMENT_OTHER)
Admission: EM | Admit: 2024-04-10 | Discharge: 2024-04-11 | Disposition: A | Attending: Emergency Medicine | Admitting: Emergency Medicine

## 2024-04-10 ENCOUNTER — Encounter (HOSPITAL_BASED_OUTPATIENT_CLINIC_OR_DEPARTMENT_OTHER): Payer: Self-pay

## 2024-04-10 DIAGNOSIS — R600 Localized edema: Secondary | ICD-10-CM | POA: Insufficient documentation

## 2024-04-10 LAB — CBC WITH DIFFERENTIAL/PLATELET
Abs Immature Granulocytes: 0.02 K/uL (ref 0.00–0.07)
Basophils Absolute: 0 K/uL (ref 0.0–0.1)
Basophils Relative: 0 %
Eosinophils Absolute: 0.3 K/uL (ref 0.0–0.5)
Eosinophils Relative: 5 %
HCT: 39.3 % (ref 39.0–52.0)
Hemoglobin: 12.8 g/dL — ABNORMAL LOW (ref 13.0–17.0)
Immature Granulocytes: 0 %
Lymphocytes Relative: 41 %
Lymphs Abs: 2.3 K/uL (ref 0.7–4.0)
MCH: 27.6 pg (ref 26.0–34.0)
MCHC: 32.6 g/dL (ref 30.0–36.0)
MCV: 84.7 fL (ref 80.0–100.0)
Monocytes Absolute: 0.3 K/uL (ref 0.1–1.0)
Monocytes Relative: 6 %
Neutro Abs: 2.6 K/uL (ref 1.7–7.7)
Neutrophils Relative %: 48 %
Platelets: 220 K/uL (ref 150–400)
RBC: 4.64 MIL/uL (ref 4.22–5.81)
RDW: 14.8 % (ref 11.5–15.5)
WBC: 5.5 K/uL (ref 4.0–10.5)
nRBC: 0 % (ref 0.0–0.2)

## 2024-04-10 LAB — COMPREHENSIVE METABOLIC PANEL WITH GFR
ALT: 15 U/L (ref 0–44)
AST: 23 U/L (ref 15–41)
Albumin: 3.6 g/dL (ref 3.5–5.0)
Alkaline Phosphatase: 90 U/L (ref 38–126)
Anion gap: 11 (ref 5–15)
BUN: 10 mg/dL (ref 6–20)
CO2: 25 mmol/L (ref 22–32)
Calcium: 9 mg/dL (ref 8.9–10.3)
Chloride: 103 mmol/L (ref 98–111)
Creatinine, Ser: 0.98 mg/dL (ref 0.61–1.24)
GFR, Estimated: >60 mL/min
Glucose, Bld: 89 mg/dL (ref 70–99)
Potassium: 4 mmol/L (ref 3.5–5.1)
Sodium: 139 mmol/L (ref 135–145)
Total Bilirubin: 0.4 mg/dL (ref 0.0–1.2)
Total Protein: 9 g/dL — ABNORMAL HIGH (ref 6.5–8.1)

## 2024-04-10 NOTE — ED Triage Notes (Addendum)
 Pt states he has bee wearing thigh high compression stocking x 3 months Notice increased swelling past week.   Can only get stockings up to knee because of the increase in size  Was on a 6 day regimen on Lasix  a few weeks ago.

## 2024-04-10 NOTE — ED Provider Notes (Signed)
 " [facility] Provider Note   CSN: [id number] Arrival date & time: 04/10/24  2235     Patient presents with: No chief complaint on file.   Martin [name] is a 48 y.o. male.  {Add pertinent medical, surgical, social history, OB history to YEP:67052} Presents to the emergency department for evaluation of right leg swelling.  Patient has a history of chronic swelling of the right leg, normally manages this with compression stockings.  Patient reports that over the last week or so swelling has gotten so severe that he cannot pull the compression stocking all the way up.  Patient denies any injury.  His doctor called and a week course of Lasix  about 3 weeks ago which helped a little but he does not have anymore.       Prior to Admission medications  Medication Sig Start Date End Date Taking? Authorizing Provider  Adalimumab (HUMIRA PEN [place]) Inject into the skin. Patient not taking: Reported on 12/03/2020    [provider]  doxycycline  (VIBRAMYCIN ) 100 MG capsule Take 1 capsule (100 mg total) by mouth 2 (two) times daily. Patient not taking: Reported on 12/03/2020 11/16/20   Emelia Sluder, PA-C  Emollient [facility] ADVANCED CARE EX) Apply 1 application topically daily as needed (for eczema). Patient not taking: Reported on 12/03/2020    [provider]  ferrous sulfate  325 (65 FE) MG EC tablet Take 1 tablet (325 mg total) by mouth daily with breakfast. Patient not taking: Reported on 12/03/2020 09/06/19   Fleming, Zelda W, NP  furosemide  (LASIX ) 20 MG tablet Take 1 tablet (20 mg total) by mouth daily. 12/16/23   Vivienne Delon HERO, PA-C    Allergies: Patient has no known allergies.    Review of Systems  Updated Vital Signs BP 134/83 (BP Location: Right Arm)   Pulse 72   Temp 97.7 F (36.5 C)   Resp 18   Ht 6' 1 (1.854 m)   Wt 102.5 kg   SpO2 98%   BMI 29.82 kg/m   Physical Exam Vitals and nursing note reviewed.   Constitutional:      General: He is not in acute distress.    Appearance: He is well-developed.  HENT:     Head: Normocephalic and atraumatic.     Mouth/Throat:     Mouth: Mucous membranes are moist.  Eyes:     General: Vision grossly intact. Gaze aligned appropriately.     Extraocular Movements: Extraocular movements intact.     Conjunctiva/sclera: Conjunctivae normal.  Cardiovascular:     Rate and Rhythm: Normal rate and regular rhythm.     Pulses:          Dorsalis pedis pulses are 1+ on the right side.     Heart sounds: Normal heart sounds, S1 normal and S2 normal. No murmur heard.    No friction rub. No gallop.  Pulmonary:     Effort: Pulmonary effort is normal. No respiratory distress.     Breath sounds: Normal breath sounds.  Abdominal:     Palpations: Abdomen is soft.     Tenderness: There is no abdominal tenderness. There is no guarding or rebound.     Hernia: No hernia is present.  Musculoskeletal:        General: No swelling.     Cervical back: Full passive range of motion without pain, normal range of motion and neck supple. No pain with movement, spinous process tenderness or muscular tenderness. Normal  range of motion.     Right lower leg: Swelling present.     Left lower leg: No swelling. No edema.  Skin:    General: Skin is warm and dry.     Capillary Refill: Capillary refill takes less than 2 seconds.     Findings: No ecchymosis, erythema, lesion or wound.  Neurological:     Mental Status: He is alert and oriented to person, place, and time.     GCS: GCS eye subscore is 4. GCS verbal subscore is 5. GCS motor subscore is 6.     Cranial Nerves: Cranial nerves 2-12 are intact.     Sensory: Sensation is intact.     Motor: Motor function is intact. No weakness or abnormal muscle tone.     Coordination: Coordination is intact.  Psychiatric:        Mood and Affect: Mood normal.        Speech: Speech normal.        Behavior: Behavior normal.     (all labs  ordered are listed, but only abnormal results are displayed) Labs Reviewed  CBC WITH DIFFERENTIAL/PLATELET - Abnormal; Notable for the following components:      Result Value   Hemoglobin 12.8 (*)    All other components within normal limits  COMPREHENSIVE METABOLIC PANEL WITH GFR - Abnormal; Notable for the following components:   Total Protein 9.0 (*)    All other components within normal limits  D-DIMER, QUANTITATIVE    EKG: None  Radiology: No results found.  {Document cardiac monitor, telemetry assessment procedure when appropriate:32947} Procedures   Medications Ordered in the ED - No data to display    {Click here for ABCD2, HEART and other calculators REFRESH Note before signing:1}                              Medical Decision Making Amount and/or Complexity of Data Reviewed Labs: ordered.   ***  {Document critical care time when appropriate  Document review of labs and clinical decision tools ie CHADS2VASC2, etc  Document your independent review of radiology images and any outside records  Document your discussion with family members, caretakers and with consultants  Document social determinants of health affecting pt's care  Document your decision making why or why not admission, treatments were needed:32947:::1}   Final diagnoses:  None    ED Discharge Orders     None        "

## 2024-04-11 ENCOUNTER — Emergency Department (HOSPITAL_BASED_OUTPATIENT_CLINIC_OR_DEPARTMENT_OTHER)
Admit: 2024-04-11 | Discharge: 2024-04-11 | Disposition: A | Discharge status: Home / Self Care | Attending: Emergency Medicine | Admitting: Emergency Medicine

## 2024-04-11 LAB — D-DIMER, QUANTITATIVE: D-Dimer, Quant: 0.54 ug{FEU}/mL — ABNORMAL HIGH (ref 0.00–0.50)

## 2024-04-11 MED ORDER — FUROSEMIDE 20 MG PO TABS: 20.0000 mg | ORAL_TABLET | Freq: Every day | ORAL | 0 refills | Status: AC
